# Patient Record
Sex: Female | Born: 1990 | Race: Black or African American | Hispanic: No | Marital: Single | Smoking: Never smoker
Health system: Southern US, Community
[De-identification: ages and names within clinical notes are randomized; demographics above are authoritative.]

## PROBLEM LIST (undated history)

## (undated) DIAGNOSIS — D259 Leiomyoma of uterus, unspecified: Secondary | ICD-10-CM

## (undated) HISTORY — PX: WISDOM TOOTH EXTRACTION: SHX21

---

## 2012-04-13 ENCOUNTER — Emergency Department (HOSPITAL_COMMUNITY)
Admission: EM | Admit: 2012-04-13 | Discharge: 2012-04-13 | Disposition: A | Discharge status: Home / Self Care | Payer: No Typology Code available for payment source | Attending: Emergency Medicine | Admitting: Emergency Medicine

## 2012-04-13 ENCOUNTER — Encounter (HOSPITAL_COMMUNITY): Payer: Self-pay

## 2012-04-13 ENCOUNTER — Emergency Department (HOSPITAL_COMMUNITY): Payer: Self-pay

## 2012-04-13 DIAGNOSIS — S4980XA Other specified injuries of shoulder and upper arm, unspecified arm, initial encounter: Secondary | ICD-10-CM | POA: Insufficient documentation

## 2012-04-13 DIAGNOSIS — S4992XA Unspecified injury of left shoulder and upper arm, initial encounter: Secondary | ICD-10-CM

## 2012-04-13 DIAGNOSIS — M25519 Pain in unspecified shoulder: Secondary | ICD-10-CM | POA: Insufficient documentation

## 2012-04-13 DIAGNOSIS — S46909A Unspecified injury of unspecified muscle, fascia and tendon at shoulder and upper arm level, unspecified arm, initial encounter: Secondary | ICD-10-CM | POA: Insufficient documentation

## 2012-04-13 MED ORDER — OXYCODONE-ACETAMINOPHEN 7.5-325 MG PO TABS: 1.0000 | ORAL_TABLET | ORAL | Status: DC | PRN

## 2012-04-13 MED ORDER — OXYCODONE-ACETAMINOPHEN 5-325 MG PO TABS
2.0000 | ORAL_TABLET | Freq: Once | ORAL | Status: AC
  Administered 2012-04-13: 2 via ORAL
  Filled 2012-04-13: qty 2

## 2012-04-13 NOTE — ED Notes (Signed)
Patient transported to X-ray 
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
## 2012-04-13 NOTE — Progress Notes (Signed)
Orthopedic Tech Progress Note Patient Details:  Lisa Herring 03-21-1991 [id number]  Ortho Devices Type of Ortho Device: Arm foam sling Ortho Device/Splint Location: left arm Ortho Device/Splint Interventions: Application   Lisa Herring 04/13/2012, 5:27 PM
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
## 2012-07-01 ENCOUNTER — Encounter (HOSPITAL_BASED_OUTPATIENT_CLINIC_OR_DEPARTMENT_OTHER): Payer: Self-pay | Admitting: *Deleted

## 2012-07-01 DIAGNOSIS — R51 Headache: Secondary | ICD-10-CM | POA: Insufficient documentation

## 2012-07-01 DIAGNOSIS — Z79899 Other long term (current) drug therapy: Secondary | ICD-10-CM | POA: Insufficient documentation

## 2012-07-01 DIAGNOSIS — Z77028 Contact with and (suspected) exposure to other hazardous aromatic compounds: Secondary | ICD-10-CM | POA: Insufficient documentation

## 2012-07-01 NOTE — ED Notes (Signed)
Dizziness, nausea, and headache earlier while at work. Possible gas leak exposure per patient.

## 2012-07-02 ENCOUNTER — Emergency Department (HOSPITAL_BASED_OUTPATIENT_CLINIC_OR_DEPARTMENT_OTHER)
Admission: EM | Admit: 2012-07-02 | Discharge: 2012-07-02 | Disposition: A | Discharge status: Home / Self Care | Payer: Worker's Compensation | Attending: Emergency Medicine | Admitting: Emergency Medicine

## 2012-07-02 DIAGNOSIS — Z7729 Contact with and (suspected ) exposure to other hazardous substances: Secondary | ICD-10-CM

## 2012-07-02 DIAGNOSIS — R51 Headache: Secondary | ICD-10-CM

## 2012-07-02 LAB — POCT I-STAT 3, VENOUS BLOOD GAS (G3P V)
O2 Saturation: 52 %
Patient temperature: 98.2
TCO2: 28 mmol/L (ref 0–100)
pCO2, Ven: 43.8 mmHg — ABNORMAL LOW (ref 45.0–50.0)
pH, Ven: 7.394 — ABNORMAL HIGH (ref 7.250–7.300)

## 2012-07-02 MED ORDER — ONDANSETRON HCL 8 MG PO TABS
4.0000 mg | ORAL_TABLET | Freq: Once | ORAL | Status: AC
  Administered 2012-07-02: 4 mg via ORAL
  Filled 2012-07-02: qty 1

## 2012-07-02 MED ORDER — IBUPROFEN 800 MG PO TABS
800.0000 mg | ORAL_TABLET | Freq: Once | ORAL | Status: AC
  Administered 2012-07-02: 800 mg via ORAL
  Filled 2012-07-02: qty 1

## 2012-07-02 NOTE — ED Notes (Signed)
MD at bedside. 

## 2012-07-02 NOTE — ED Provider Notes (Signed)
History     CSN: [id number]  Arrival date & time 07/01/12  2248   First MD Initiated Contact with Patient 07/02/12 0011      Chief Complaint  Patient presents with  . Dizziness    (Consider location/radiation/quality/duration/timing/severity/associated sxs/prior treatment) The history is provided by the patient.  Lisa Herring is a 22 y.o. female here with dizziness, nausea, and headache and possible carbon monoxide exposure. 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 Denies being pregnant. Has some dizziness and headaches. No vomiting or SOB.    History reviewed. No pertinent past medical history.  History reviewed. No pertinent past surgical history.  No family history on file.  History  Substance Use Topics  . Smoking status: Never Smoker   . Smokeless tobacco: Not on file  . Alcohol Use: Yes    OB History    Grav Para Term Preterm Abortions TAB SAB Ect Mult Living                  Review of Systems  Neurological: Positive for dizziness and headaches.  All other systems reviewed and are negative.    Allergies  Review of patient's allergies indicates no known allergies.  Home Medications   Current Outpatient Rx  Name  Route  Sig  Dispense  Refill  . BIOTIN PO   Oral   Take 1 tablet by mouth daily.         . OXYCODONE-ACETAMINOPHEN 7.5-325 MG PO TABS   Oral   Take 1 tablet by mouth every 4 (four) hours as needed for pain.   30 tablet   0     BP 137/73  Pulse 87  Temp 98.2 F (36.8 C) (Oral)  Resp 20  Ht 5\' 6"  (1.676 m)  Wt 175 lb (79.379 kg)  BMI 28.25 kg/m2  SpO2 99%  Physical Exam  Nursing note and vitals reviewed. Constitutional: She is oriented to person, place, and time. She appears well-developed and well-nourished.  HENT:  Head: Normocephalic.  Mouth/Throat: Oropharynx is clear and moist.  Eyes: Conjunctivae normal are normal. Pupils are equal, round, and reactive to light.  Neck: Normal range of  motion. Neck supple.  Cardiovascular: Normal rate, regular rhythm and normal heart sounds.   Pulmonary/Chest: Effort normal and breath sounds normal.  Abdominal: Soft. Bowel sounds are normal.  Neurological: She is alert and oriented to person, place, and time.  Skin: Skin is warm and dry.  Psychiatric: She has a normal mood and affect. Her behavior is normal. Judgment and thought content normal.    ED Course  Procedures (including critical care time)  Labs Reviewed  POCT I-STAT 3, BLOOD GAS (G3P V) - Abnormal; Notable for the following:    pH, Ven 7.394 (*)     pCO2, Ven 43.8 (*)     pO2, Ven 28.0 (*)     Bicarbonate 26.8 (*)     All other components within normal limits  CARBOXYHEMOGLOBIN   No results found.   No diagnosis found.    MDM  Lisa Herring is a 22 y.o. female here with possible carbon monoxide exposure. Patient's CO level was 2.4%. Patient placed on non rebreather for about 2 hrs. Now felt better. Stable for d/c.         Richardean Canal, MD 07/02/12 [phone]

## 2012-07-02 NOTE — ED Notes (Signed)
RTT at bedside

## 2012-07-04 LAB — CARBOXYHEMOGLOBIN
Carboxyhemoglobin: 2.4 % — ABNORMAL HIGH (ref 0.5–1.5)
O2 Saturation: 54.3 %
Total hemoglobin: 14.3 g/dL (ref 12.0–16.0)

## 2013-04-18 ENCOUNTER — Encounter (HOSPITAL_BASED_OUTPATIENT_CLINIC_OR_DEPARTMENT_OTHER): Payer: Self-pay | Admitting: Emergency Medicine

## 2013-04-18 ENCOUNTER — Emergency Department (HOSPITAL_BASED_OUTPATIENT_CLINIC_OR_DEPARTMENT_OTHER)
Admission: EM | Admit: 2013-04-18 | Discharge: 2013-04-18 | Disposition: A | Discharge status: Home / Self Care | Payer: Medicaid Other | Attending: Emergency Medicine | Admitting: Emergency Medicine

## 2013-04-18 DIAGNOSIS — B379 Candidiasis, unspecified: Secondary | ICD-10-CM | POA: Insufficient documentation

## 2013-04-18 DIAGNOSIS — N760 Acute vaginitis: Secondary | ICD-10-CM | POA: Insufficient documentation

## 2013-04-18 DIAGNOSIS — A499 Bacterial infection, unspecified: Secondary | ICD-10-CM | POA: Insufficient documentation

## 2013-04-18 DIAGNOSIS — B9689 Other specified bacterial agents as the cause of diseases classified elsewhere: Secondary | ICD-10-CM | POA: Insufficient documentation

## 2013-04-18 DIAGNOSIS — R300 Dysuria: Secondary | ICD-10-CM | POA: Insufficient documentation

## 2013-04-18 DIAGNOSIS — Z3202 Encounter for pregnancy test, result negative: Secondary | ICD-10-CM | POA: Insufficient documentation

## 2013-04-18 LAB — URINALYSIS, ROUTINE W REFLEX MICROSCOPIC
Bilirubin Urine: NEGATIVE
Glucose, UA: NEGATIVE mg/dL
Ketones, ur: NEGATIVE mg/dL
Protein, ur: NEGATIVE mg/dL
pH: 5.5 (ref 5.0–8.0)

## 2013-04-18 LAB — URINE MICROSCOPIC-ADD ON

## 2013-04-18 LAB — WET PREP, GENITAL: Trich, Wet Prep: NONE SEEN

## 2013-04-18 MED ORDER — METRONIDAZOLE 500 MG PO TABS: 500.0000 mg | ORAL_TABLET | Freq: Two times a day (BID) | ORAL | Status: DC

## 2013-04-18 MED ORDER — FLUCONAZOLE 200 MG PO TABS: 200.0000 mg | ORAL_TABLET | Freq: Once | ORAL | Status: AC

## 2013-04-18 NOTE — ED Notes (Signed)
Pt c/o vag burning, vag discharge and itching onset last thursday

## 2013-04-18 NOTE — ED Provider Notes (Signed)
CSN: [id number]     Arrival date & time 04/18/13  1819 History   First MD Initiated Contact with Patient 04/18/13 1822     Chief Complaint  Patient presents with  . Vaginitis    itching and buring with urination   (Consider location/radiation/quality/duration/timing/severity/associated sxs/prior Treatment) The history is provided by the patient and medical records.   This is a 22 year old female with no significant past medical history presenting to the ED for vaginal itching and dysuria x2 days. Patient states she has had some intermittent vaginal discharge, clear in color without odor.  No new sexual partners or concerns for STD at this time. No recent fevers, sweats, or chills.  No abdominal pain, N/V/D.  History reviewed. No pertinent past medical history. History reviewed. No pertinent past surgical history. History reviewed. No pertinent family history. History  Substance Use Topics  . Smoking status: Never Smoker   . Smokeless tobacco: Not on file  . Alcohol Use: Yes   OB History   Grav Para Term Preterm Abortions TAB SAB Ect Mult Living                 Review of Systems  Genitourinary: Positive for dysuria and vaginal discharge.  All other systems reviewed and are negative.    Allergies  Review of patient's allergies indicates no known allergies.  Home Medications  No current outpatient prescriptions on file. BP 118/96  Pulse 60  Temp(Src) 98.2 F (36.8 C) (Oral)  Resp 20  Wt 158 lb (71.668 kg)  BMI 25.51 kg/m2  SpO2 100%  Physical Exam  Nursing note and vitals reviewed. Constitutional: She is oriented to person, place, and time. She appears well-developed and well-nourished.  HENT:  Head: Normocephalic and atraumatic.  Mouth/Throat: Oropharynx is clear and moist.  Eyes: Conjunctivae and EOM are normal. Pupils are equal, round, and reactive to light.  Neck: Normal range of motion.  Cardiovascular: Normal rate, regular rhythm and normal heart sounds.    Pulmonary/Chest: Effort normal and breath sounds normal.  Abdominal: Soft. Bowel sounds are normal. There is no tenderness. There is no guarding.  Genitourinary: There is no lesion on the right labia. There is no lesion on the left labia. Cervix exhibits no motion tenderness. Right adnexum displays no mass and no tenderness. Left adnexum displays no mass and no tenderness. No bleeding around the vagina. Vaginal discharge found.  Clitoral piercing in place; thick, curd-like vaginal discharge present with foul odor; no bleeding; no masses felt; no adnexal or CMT  Musculoskeletal: Normal range of motion.  Neurological: She is alert and oriented to person, place, and time.  Skin: Skin is warm and dry.  Psychiatric: She has a normal mood and affect.    ED Course  Procedures (including critical care time) Labs Review Labs Reviewed  WET PREP, GENITAL - Abnormal; Notable for the following:    Clue Cells Wet Prep HPF POC MODERATE (*)    WBC, Wet Prep HPF POC MANY (*)    All other components within normal limits  URINALYSIS, ROUTINE W REFLEX MICROSCOPIC - Abnormal; Notable for the following:    APPearance CLOUDY (*)    Hgb urine dipstick SMALL (*)    Leukocytes, UA LARGE (*)    All other components within normal limits  URINE MICROSCOPIC-ADD ON - Abnormal; Notable for the following:    Squamous Epithelial / LPF FEW (*)    Bacteria, UA FEW (*)    All other components within normal limits  GC/CHLAMYDIA PROBE  AMP  URINE CULTURE  PREGNANCY, URINE   Imaging Review No results found.  EKG Interpretation   None       MDM   1. Bacterial vaginosis   2. Yeast infection   3. Dysuria    U-preg negative. U/a as above, large leuks- culture pending. Wet prep with moderate clue cells, no yeast seen but pelvic exam consistent with yeast infection.  Will tx with fluconazole and flagyl-- advised not to drink EtOH while taking flagyl.  Instructed she would be contacted in 48-72 hours if culture  results abnormal or require tx.  Discussed plan with pt, she agreed.  Return precautions advised.  Garlon Hatchet, PA-C 04/18/13 2000

## 2013-04-18 NOTE — ED Notes (Signed)
Vaginal itching and dysuria

## 2013-04-18 NOTE — ED Provider Notes (Signed)
Medical screening examination/treatment/procedure(s) were performed by non-physician practitioner and as supervising physician I was immediately available for consultation/collaboration.   Shon Baton, MD 04/18/13 2013

## 2013-04-19 LAB — URINE CULTURE: Colony Count: 70000

## 2013-04-19 LAB — GC/CHLAMYDIA PROBE AMP
CT Probe RNA: NEGATIVE
GC Probe RNA: NEGATIVE

## 2013-10-02 ENCOUNTER — Emergency Department (HOSPITAL_BASED_OUTPATIENT_CLINIC_OR_DEPARTMENT_OTHER)
Admission: EM | Admit: 2013-10-02 | Discharge: 2013-10-02 | Disposition: A | Discharge status: Home / Self Care | Payer: No Typology Code available for payment source | Attending: Emergency Medicine | Admitting: Emergency Medicine

## 2013-10-02 ENCOUNTER — Encounter (HOSPITAL_BASED_OUTPATIENT_CLINIC_OR_DEPARTMENT_OTHER): Payer: Self-pay | Admitting: Emergency Medicine

## 2013-10-02 DIAGNOSIS — A499 Bacterial infection, unspecified: Secondary | ICD-10-CM | POA: Insufficient documentation

## 2013-10-02 DIAGNOSIS — Z3202 Encounter for pregnancy test, result negative: Secondary | ICD-10-CM | POA: Insufficient documentation

## 2013-10-02 DIAGNOSIS — N760 Acute vaginitis: Secondary | ICD-10-CM | POA: Insufficient documentation

## 2013-10-02 DIAGNOSIS — B9689 Other specified bacterial agents as the cause of diseases classified elsewhere: Secondary | ICD-10-CM | POA: Insufficient documentation

## 2013-10-02 LAB — WET PREP, GENITAL
Trich, Wet Prep: NONE SEEN
Yeast Wet Prep HPF POC: NONE SEEN

## 2013-10-02 LAB — URINALYSIS, ROUTINE W REFLEX MICROSCOPIC
Bilirubin Urine: NEGATIVE
Glucose, UA: NEGATIVE mg/dL
Hgb urine dipstick: NEGATIVE
Ketones, ur: NEGATIVE mg/dL
Nitrite: NEGATIVE
Protein, ur: NEGATIVE mg/dL
Specific Gravity, Urine: 1.022 (ref 1.005–1.030)
Urobilinogen, UA: 1 mg/dL (ref 0.0–1.0)
pH: 6 (ref 5.0–8.0)

## 2013-10-02 LAB — URINE MICROSCOPIC-ADD ON

## 2013-10-02 LAB — PREGNANCY, URINE: Preg Test, Ur: NEGATIVE

## 2013-10-02 MED ORDER — METRONIDAZOLE 500 MG PO TABS: 500.0000 mg | ORAL_TABLET | Freq: Two times a day (BID) | ORAL | Status: DC

## 2013-10-02 MED ORDER — METRONIDAZOLE 500 MG PO TABS
500.0000 mg | ORAL_TABLET | Freq: Once | ORAL | Status: AC
Start: 2013-10-02 — End: 2013-10-02
  Administered 2013-10-02: 500 mg via ORAL
  Filled 2013-10-02: qty 1

## 2013-10-02 NOTE — Discharge Instructions (Signed)

## 2013-10-02 NOTE — ED Notes (Signed)
Vaginal d/c x 3 days 

## 2013-10-02 NOTE — ED Provider Notes (Signed)
CSN: [id number]     Arrival date & time 10/02/13  1701 History   First MD Initiated Contact with Patient 10/02/13 1821     Chief Complaint  Patient presents with  . Vaginal Discharge     (Consider location/radiation/quality/duration/timing/severity/associated sxs/prior Treatment) HPI  23 year old female with no prior history of STD presents complaining of vaginal discharge. Patient states for the past 2-3 days she has noticed vaginal discharge with odor. The odor is what concerns her prompting her to come to ER. Aside from vaginal discharge patient denies fever, chills, headache, chest pain, shortness of breath, abdominal pain, back pain, dysuria, hematuria, rash. She denies any pain with sexual activities. She has one sexual partners within the past 6 months but does not use protection every single time. She has never been pregnant before.  History reviewed. No pertinent past medical history. History reviewed. No pertinent past surgical history. No family history on file. History  Substance Use Topics  . Smoking status: Never Smoker   . Smokeless tobacco: Not on file  . Alcohol Use: No   OB History   Grav Para Term Preterm Abortions TAB SAB Ect Mult Living                 Review of Systems  Constitutional: Negative for fever.  Genitourinary: Positive for vaginal discharge. Negative for dysuria, vaginal bleeding and vaginal pain.  Skin: Negative for rash.  All other systems reviewed and are negative.      Allergies  Review of patient's allergies indicates no known allergies.  Home Medications   Current Outpatient Rx  Name  Route  Sig  Dispense  Refill  . metroNIDAZOLE (FLAGYL) 500 MG tablet   Oral   Take 1 tablet (500 mg total) by mouth 2 (two) times daily.   14 tablet   0    BP 127/68  Pulse 71  Temp(Src) 98.6 F (37 C) (Oral)  Resp 16  Ht 5\' 6"  (1.676 m)  Wt 160 lb (72.576 kg)  BMI 25.84 kg/m2  SpO2 100%  LMP 09/08/2013 Physical Exam  Nursing note and  vitals reviewed. Constitutional: She appears well-developed and well-nourished. No distress.  HENT:  Head: Normocephalic and atraumatic.  Eyes: Conjunctivae are normal.  Neck: Normal range of motion. Neck supple.  Cardiovascular: Normal rate and regular rhythm.   Pulmonary/Chest: Effort normal and breath sounds normal. She exhibits no tenderness.  Abdominal: Soft. There is no tenderness.  Genitourinary: Vagina normal and uterus normal. There is no rash or lesion on the right labia. There is no rash or lesion on the left labia. Cervix exhibits no motion tenderness and no discharge. Right adnexum displays no mass and no tenderness. Left adnexum displays no mass and no tenderness. No erythema, tenderness or bleeding around the vagina. No vaginal discharge found.  Chaperone present:  No inguinal lymphadenopathy, normal labia majora, normal labia minora, clitoris piercing noted without any signs of infection, vaginal vault with mild functional discharge with odor. On bimanual exam, no left or right adnexal tenderness and no cervical motion tenderness, cervical os is closed.  Lymphadenopathy:       Right: No inguinal adenopathy present.       Left: No inguinal adenopathy present.  Skin: No rash noted.    ED Course  Procedures (including critical care time)  7:38 PM Patient presents with vaginal discharge. No evidence of cervical motion tenderness concerning for PID. She is afebrile with stable normal vital sign and has a benign abdomen. I offered  the options of being treated prophylactically for GC and Chlamydia with Rocephin and Zithromax however patient prefers to wait.    9:03 PM Result indicate BV.  Will treat with flagyl.  Return precaution discussed.    Labs Review Labs Reviewed  WET PREP, GENITAL - Abnormal; Notable for the following:    Clue Cells Wet Prep HPF POC TOO NUMEROUS TO COUNT (*)    WBC, Wet Prep HPF POC FEW (*)    All other components within normal limits  URINALYSIS,  ROUTINE W REFLEX MICROSCOPIC - Abnormal; Notable for the following:    APPearance CLOUDY (*)    Leukocytes, UA MODERATE (*)    All other components within normal limits  URINE MICROSCOPIC-ADD ON - Abnormal; Notable for the following:    Squamous Epithelial / LPF FEW (*)    All other components within normal limits  GC/CHLAMYDIA PROBE AMP  PREGNANCY, URINE   Imaging Review No results found.   EKG Interpretation None      MDM   Final diagnoses:  BV (bacterial vaginosis)    BP 127/68  Pulse 71  Temp(Src) 98.6 F (37 C) (Oral)  Resp 16  Ht 5\' 6"  (1.676 m)  Wt 160 lb (72.576 kg)  BMI 25.84 kg/m2  SpO2 100%  LMP 09/08/2013     Fayrene Helper, PA-C 10/02/13 2103

## 2013-10-02 NOTE — ED Provider Notes (Signed)
Medical screening examination/treatment/procedure(s) were performed by non-physician practitioner and as supervising physician I was immediately available for consultation/collaboration.     Juliann Olesky, MD 10/02/13 2251 

## 2013-10-03 LAB — GC/CHLAMYDIA PROBE AMP
CT Probe RNA: NEGATIVE
GC PROBE AMP APTIMA: NEGATIVE
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
## 2022-06-30 ENCOUNTER — Ambulatory Visit
Admission: EM | Admit: 2022-06-30 | Discharge: 2022-06-30 | Disposition: A | Discharge status: Home / Self Care | Payer: 59 | Attending: Family Medicine | Admitting: Family Medicine

## 2022-06-30 DIAGNOSIS — J069 Acute upper respiratory infection, unspecified: Secondary | ICD-10-CM | POA: Diagnosis not present

## 2022-06-30 MED ORDER — IBUPROFEN 800 MG PO TABS: 800.0000 mg | ORAL_TABLET | Freq: Three times a day (TID) | ORAL | 0 refills | Status: DC | PRN

## 2022-06-30 NOTE — Discharge Instructions (Signed)
Take ibuprofen 800 mg--1 tab every 8 hours as needed for pain.   You have been swabbed for COVID, and the test will result in the next 24 hours. Our staff will call you if positive. If the COVID test is positive, you should quarantine for 5 days from the start of your symptoms

## 2022-06-30 NOTE — ED Provider Notes (Signed)
[facility]    CSN: [id number] Arrival date & time: 06/30/22  1216      History   Chief Complaint Chief Complaint  Patient presents with   Ear Fullness    HPI Lisa Herring is a 32 y.o. female.    Ear Fullness   Here for itchy feeling in her throat and pain in her ears.  On December 31 she had some itching in her throat and cough drops help that get better.  No throat pain.  Then yesterday her ear started hurting and it first her left was worse now that is improved some and her right one is worse.  No fever or chills.  She does also have some nasal congestion and rhinorrhea.  No cough.  No nausea or vomiting or diarrhea  Last menstrual cycle was December 2  History reviewed. No pertinent past medical history.  There are no problems to display for this patient.   History reviewed. No pertinent surgical history.  OB History   No obstetric history on file.      Home Medications    Prior to Admission medications   Medication Sig Start Date End Date Taking? Authorizing Provider  ibuprofen (ADVIL) 800 MG tablet Take 1 tablet (800 mg total) by mouth every 8 (eight) hours as needed (pain). 06/30/22  Yes Demarkis Gheen, Gwenlyn Perking, MD  norethindrone-ethinyl estradiol-FE (LOESTRIN FE) 1-20 MG-MCG tablet Take 1 tablet by mouth daily.   Yes [provider]    Family History No family history on file.  Social History Social History   Tobacco Use   Smoking status: Never   Smokeless tobacco: Never  Vaping Use   Vaping Use: Never used  Substance Use Topics   Alcohol use: No   Drug use: No     Allergies   Patient has no known allergies.   Review of Systems Review of Systems   Physical Exam Triage Vital Signs ED Triage Vitals  Enc Vitals Group     BP 06/30/22 1225 (!) 150/79     Pulse Rate 06/30/22 1225 79     Resp 06/30/22 1225 15     Temp 06/30/22 1225 98.1 F (36.7 C)     Temp Source 06/30/22 1225 Oral     SpO2 06/30/22 1225 96 %      Weight --      Height --      Head Circumference --      Peak Flow --      Pain Score 06/30/22 1229 0     Pain Loc --      Pain Edu? --      Excl. in [place]? --    No data found.  Updated Vital Signs BP (!) 150/79 (BP Location: Right Arm)   Pulse 79   Temp 98.1 F (36.7 C) (Oral)   Resp 15   LMP 05/30/2022 (Approximate)   SpO2 96%   Visual Acuity Right Eye Distance:   Left Eye Distance:   Bilateral Distance:    Right Eye Near:   Left Eye Near:    Bilateral Near:     Physical Exam Vitals reviewed.  Constitutional:      General: She is not in acute distress.    Appearance: She is not toxic-appearing.  HENT:     Right Ear: Tympanic membrane and ear canal normal.     Left Ear: Tympanic membrane and ear canal normal.     Nose: Congestion present.     Mouth/Throat:  Mouth: Mucous membranes are moist.     Pharynx: No oropharyngeal exudate or posterior oropharyngeal erythema.  Eyes:     Extraocular Movements: Extraocular movements intact.     Conjunctiva/sclera: Conjunctivae normal.     Pupils: Pupils are equal, round, and reactive to light.  Cardiovascular:     Rate and Rhythm: Normal rate and regular rhythm.     Heart sounds: No murmur heard. Pulmonary:     Effort: Pulmonary effort is normal. No respiratory distress.     Breath sounds: No stridor. No wheezing, rhonchi or rales.  Musculoskeletal:     Cervical back: Neck supple.  Lymphadenopathy:     Cervical: No cervical adenopathy.  Skin:    Capillary Refill: Capillary refill takes less than 2 seconds.     Coloration: Skin is not jaundiced or pale.  Neurological:     General: No focal deficit present.     Mental Status: She is alert and oriented to person, place, and time.  Psychiatric:        Behavior: Behavior normal.      UC Treatments / Results  Labs (all labs ordered are listed, but only abnormal results are displayed) Labs Reviewed  SARS CORONAVIRUS 2 (TAT 6-24 HRS)    EKG   Radiology No  results found.  Procedures Procedures (including critical care time)  Medications Ordered in UC Medications - No data to display  Initial Impression / Assessment and Plan / UC Course  I have reviewed the triage vital signs and the nursing notes.  Pertinent labs & imaging results that were available during my care of the patient were reviewed by me and considered in my medical decision making (see chart for details).        Most likely is either a mild viral upper respiratory infection or allergies causing her symptoms.  Ibuprofen is sent for the pain, and she is going to continue NyQuil and DayQuil as needed.  COVID swab was done, so she knows if she needs to quarantine.  Work note is supplied; she works remotely so she will be able to work tomorrow as she requests Final Clinical Impressions(s) / UC Diagnoses   Final diagnoses:  Viral URI     Discharge Instructions      Take ibuprofen 800 mg--1 tab every 8 hours as needed for pain.   You have been swabbed for COVID, and the test will result in the next 24 hours. Our staff will call you if positive. If the COVID test is positive, you should quarantine for 5 days from the start of your symptoms      ED Prescriptions     Medication Sig Dispense Auth. Provider   ibuprofen (ADVIL) 800 MG tablet Take 1 tablet (800 mg total) by mouth every 8 (eight) hours as needed (pain). 21 tablet Zayonna Ayuso, Gwenlyn Perking, MD      PDMP not reviewed this encounter.   Barrett Henle, MD 06/30/22 [phone]

## 2022-06-30 NOTE — ED Notes (Signed)
PT REFUSED COVID TEST DUE TO TEST IS NOT RAPID-MD NOTIFIED

## 2022-06-30 NOTE — ED Triage Notes (Signed)
Pt c/o bilat ear pressure started yesterday-left ear worse than right-itchy throat stated 3 days ago/is better-NAD-steady gait

## 2023-05-15 ENCOUNTER — Emergency Department (HOSPITAL_COMMUNITY): Payer: 59

## 2023-05-15 ENCOUNTER — Encounter (HOSPITAL_COMMUNITY): Payer: Self-pay | Admitting: Emergency Medicine

## 2023-05-15 ENCOUNTER — Inpatient Hospital Stay (HOSPITAL_COMMUNITY)
Admission: EM | Admit: 2023-05-15 | Discharge: 2023-05-19 | DRG: 661 | Disposition: A | Discharge status: Home / Self Care | Payer: 59 | Attending: Internal Medicine | Admitting: Internal Medicine

## 2023-05-15 ENCOUNTER — Other Ambulatory Visit: Payer: Self-pay

## 2023-05-15 DIAGNOSIS — R109 Unspecified abdominal pain: Secondary | ICD-10-CM | POA: Diagnosis not present

## 2023-05-15 DIAGNOSIS — N179 Acute kidney failure, unspecified: Secondary | ICD-10-CM | POA: Diagnosis present

## 2023-05-15 DIAGNOSIS — N131 Hydronephrosis with ureteral stricture, not elsewhere classified: Secondary | ICD-10-CM | POA: Diagnosis not present

## 2023-05-15 DIAGNOSIS — N3289 Other specified disorders of bladder: Secondary | ICD-10-CM | POA: Diagnosis present

## 2023-05-15 DIAGNOSIS — N939 Abnormal uterine and vaginal bleeding, unspecified: Secondary | ICD-10-CM | POA: Diagnosis not present

## 2023-05-15 DIAGNOSIS — Z6830 Body mass index (BMI) 30.0-30.9, adult: Secondary | ICD-10-CM

## 2023-05-15 DIAGNOSIS — N852 Hypertrophy of uterus: Secondary | ICD-10-CM

## 2023-05-15 DIAGNOSIS — R319 Hematuria, unspecified: Secondary | ICD-10-CM | POA: Diagnosis not present

## 2023-05-15 DIAGNOSIS — Z793 Long term (current) use of hormonal contraceptives: Secondary | ICD-10-CM

## 2023-05-15 DIAGNOSIS — Z7982 Long term (current) use of aspirin: Secondary | ICD-10-CM

## 2023-05-15 DIAGNOSIS — N858 Other specified noninflammatory disorders of uterus: Secondary | ICD-10-CM

## 2023-05-15 DIAGNOSIS — N1330 Unspecified hydronephrosis: Secondary | ICD-10-CM

## 2023-05-15 DIAGNOSIS — R52 Pain, unspecified: Secondary | ICD-10-CM

## 2023-05-15 DIAGNOSIS — K5900 Constipation, unspecified: Secondary | ICD-10-CM | POA: Diagnosis present

## 2023-05-15 DIAGNOSIS — E669 Obesity, unspecified: Secondary | ICD-10-CM | POA: Diagnosis present

## 2023-05-15 DIAGNOSIS — D259 Leiomyoma of uterus, unspecified: Secondary | ICD-10-CM | POA: Diagnosis present

## 2023-05-15 HISTORY — DX: Leiomyoma of uterus, unspecified: D25.9

## 2023-05-15 LAB — COMPREHENSIVE METABOLIC PANEL
ALT: 27 U/L (ref 0–44)
AST: 21 U/L (ref 15–41)
Albumin: 3.9 g/dL (ref 3.5–5.0)
Alkaline Phosphatase: 58 U/L (ref 38–126)
Anion gap: 11 (ref 5–15)
BUN: 11 mg/dL (ref 6–20)
CO2: 23 mmol/L (ref 22–32)
Calcium: 9.7 mg/dL (ref 8.9–10.3)
Chloride: 104 mmol/L (ref 98–111)
Creatinine, Ser: 1.3 mg/dL — ABNORMAL HIGH (ref 0.44–1.00)
GFR, Estimated: 56 mL/min — ABNORMAL LOW (ref >60)
Glucose, Bld: 106 mg/dL — ABNORMAL HIGH (ref 70–99)
Potassium: 3.8 mmol/L (ref 3.5–5.1)
Sodium: 138 mmol/L (ref 135–145)
Total Bilirubin: 0.6 mg/dL (ref <1.2)
Total Protein: 7.6 g/dL (ref 6.5–8.1)

## 2023-05-15 LAB — URINALYSIS, ROUTINE W REFLEX MICROSCOPIC
Bilirubin Urine: NEGATIVE
Glucose, UA: NEGATIVE mg/dL
Hgb urine dipstick: NEGATIVE
Ketones, ur: NEGATIVE mg/dL
Leukocytes,Ua: NEGATIVE
Nitrite: NEGATIVE
Protein, ur: NEGATIVE mg/dL
Specific Gravity, Urine: 1.018 (ref 1.005–1.030)
pH: 6 (ref 5.0–8.0)

## 2023-05-15 LAB — CBC
HCT: 43.6 % (ref 36.0–46.0)
Hemoglobin: 14.6 g/dL (ref 12.0–15.0)
MCH: 29.2 pg (ref 26.0–34.0)
MCHC: 33.5 g/dL (ref 30.0–36.0)
MCV: 87.2 fL (ref 80.0–100.0)
Platelets: 240 10*3/uL (ref 150–400)
RBC: 5 MIL/uL (ref 3.87–5.11)
RDW: 12.9 % (ref 11.5–15.5)
WBC: 5.6 10*3/uL (ref 4.0–10.5)
nRBC: 0 % (ref 0.0–0.2)

## 2023-05-15 LAB — LIPASE, BLOOD: Lipase: 32 U/L (ref 11–51)

## 2023-05-15 LAB — HCG, SERUM, QUALITATIVE: Preg, Serum: NEGATIVE

## 2023-05-15 MED ORDER — ONDANSETRON HCL 4 MG/2ML IJ SOLN
4.0000 mg | Freq: Once | INTRAMUSCULAR | Status: AC
  Administered 2023-05-15: 4 mg via INTRAVENOUS
  Filled 2023-05-15: qty 2

## 2023-05-15 MED ORDER — HYDROMORPHONE HCL 1 MG/ML IJ SOLN
0.5000 mg | Freq: Once | INTRAMUSCULAR | Status: AC
  Administered 2023-05-15: 0.5 mg via INTRAVENOUS
  Filled 2023-05-15: qty 1

## 2023-05-15 MED ORDER — HYDROMORPHONE HCL 1 MG/ML IJ SOLN
1.0000 mg | Freq: Once | INTRAMUSCULAR | Status: AC
  Administered 2023-05-15: 1 mg via INTRAVENOUS
  Filled 2023-05-15: qty 1

## 2023-05-15 MED ORDER — MORPHINE SULFATE (PF) 4 MG/ML IV SOLN
4.0000 mg | Freq: Once | INTRAVENOUS | Status: AC
  Administered 2023-05-15: 4 mg via INTRAVENOUS
  Filled 2023-05-15: qty 1

## 2023-05-15 MED ORDER — HYDROMORPHONE HCL 1 MG/ML IJ SOLN
2.0000 mg | Freq: Once | INTRAMUSCULAR | Status: DC
  Filled 2023-05-15: qty 2

## 2023-05-15 MED ORDER — KETOROLAC TROMETHAMINE 15 MG/ML IJ SOLN
15.0000 mg | Freq: Once | INTRAMUSCULAR | Status: AC
  Administered 2023-05-15: 15 mg via INTRAVENOUS
  Filled 2023-05-15: qty 1

## 2023-05-15 MED ORDER — SODIUM CHLORIDE 0.9 % IV SOLN
12.5000 mg | Freq: Four times a day (QID) | INTRAVENOUS | Status: DC | PRN
  Administered 2023-05-15 – 2023-05-18 (×2): 12.5 mg via INTRAVENOUS
  Filled 2023-05-15 (×3): qty 0.5

## 2023-05-15 MED ORDER — HYDROCODONE-ACETAMINOPHEN 5-325 MG PO TABS
2.0000 | ORAL_TABLET | Freq: Once | ORAL | Status: AC
  Administered 2023-05-15: 2 via ORAL
  Filled 2023-05-15: qty 2

## 2023-05-15 NOTE — ED Triage Notes (Signed)
PT arrives via POV from home with back pain abdominal pain and vomited multiple times since Wednesday. Seen at urgent care yesterday states symptoms worsened last night. Now doubled over in pain in triage.

## 2023-05-15 NOTE — ED Notes (Signed)
Hospitalist at bedside 

## 2023-05-15 NOTE — ED Provider Notes (Signed)
Accepted handoff at shift change from [facility]. Please see prior provider note for full HPI.  Briefly: Patient is a 32 y.o. female who presents to the ER for flank pain radiating to the groin. Went to UC and had x-ray, was told she was complicated. Had normal pelvic exam. Has received pain meds and antiemetics.   DDX/Plan: Labs unremarkable, pending UA and CT. If CT unremarkable, consider ovarian torsion r/o.   Physical Exam  BP (!) 114/47 (BP Location: Right Arm)   Pulse 95   Temp 98.3 F (36.8 C)   Resp 16   Ht 5\' 6"  (1.676 m)   Wt 86.2 kg   LMP 05/08/2023   SpO2 100%   BMI 30.67 kg/m   Physical Exam Vitals and nursing note reviewed.  Constitutional:      Appearance: Normal appearance.  HENT:     Head: Normocephalic and atraumatic.  Eyes:     Conjunctiva/sclera: Conjunctivae normal.  Cardiovascular:     Rate and Rhythm: Normal rate and regular rhythm.  Pulmonary:     Effort: Pulmonary effort is normal. No respiratory distress.     Breath sounds: Normal breath sounds.  Abdominal:     General: There is no distension.     Palpations: Abdomen is soft. There is mass.     Tenderness: There is abdominal tenderness in the right lower quadrant and suprapubic area.     Comments: Not well circumscribed lower abdominal mass to palpation  Skin:    General: Skin is warm and dry.  Neurological:     General: No focal deficit present.     Mental Status: She is alert.    ED Course / MDM    Medical Decision Making Amount and/or Complexity of Data Reviewed Labs: ordered. Radiology: ordered.  Risk Prescription drug management. Decision regarding hospitalization.  1630 -- Consulted with Dr Debroah Loop with OBGYN who reviewed patient's images, recommended consulting patient's GYN practice provider on call.  1647 -- Consulted with Dr Wendall Stade with [facility]. She reviewed patient's prior records with their office from March 2023, imaging showed 10 cm fibroid. She recommends  patient follow up for scheduling of surgery for either myomectomy or hysterectomy if patient could wait until next week. Dr Wendall Stade contacted their office to get her scheduled for follow up hopefully on Monday to discuss options and schedule surgery.   1700 -- Reevaluated patient, pain is only minimally improved. Discussed discharge with OBGYN follow up and patient does not feel comfortable with this due to her level of ongoing pain. Pelvic US ordered. Plan for admission for pain control after torsion ruled out on [place]. OBGYN will see in AM.   1930 -- Pelvic US with markedly enlarged uterus, diffusely heterogenous with no well-defined masses. No evidence of torsion.   1940 -- Consulted with hospitalist Dr Joneen Roach who felt patient could be admitted by GYN as primary team for pain control.   1950 -- Consulted again with GYN who felt patient was not a GYN-primary patient with her hydronephrosis and they would not be intervening on the uterine mass imminently. Recommended urology consult.   2012 -- Consulted with urologist Dr Margo Aye who does not feel strongly patient needs a ureteral stent imminently but they will see while admitted.   2040 -- Patient refused next dose of pain medication. Has communciated some feelings of possible side effects including chest pain, dizziness, etc.   2200 -- Hospitalist Dr Joneen Roach evaluated the patient and felt they did not meet criteria for  admission. They feel GYN must admit the patient if it is felt they must be admitted.  2240 -- Reevaluated patient. Patient and family at bedside are very frustrated by the changing of plans. Reaffirmed that our goal is to control her pain. Seen in conjunction with attending physician Dr Earlene Plater. Plan to give oral pain medicine and if patient's pain not controlled with PO, attempt admission again.   2350 -- Patient's pain is persistent despite PO medications. Concerned about not having a BM even prior to ED visit, worried this is being  worsening with multiple rounds of pain medications.   0000 -- Dr Joneen Roach will admit patient for pain control.    Jeanella Flattery 05/16/23 0002    Laurence Spates, MD 05/16/23 [phone]

## 2023-05-15 NOTE — Consult Note (Signed)
PCP:   Pcp, No   Chief Complaint:  Right flank pain.  HPI: This is a 32 year old female with no medical history.  She presents with complaints of right flank pain that has been present since Friday.  Her pain occasionally radiates to the bilateral hips.  On Wednesday the patient went to urgent care.  She was told she has constipation.  Medications taken for constipation did not impact discomfort.  Today she developed nausea and vomiting and her pain became severe.  She came to [facility], ER.  In the ER patient hemodynamically stable.  CT abdomen pelvis shows markedly enlarged uterus.  Mild to moderate hydronephrosis due to mass effect on right ureter from enlarged uterus.  Patient is received 4 mg of morphine. 0.5 and 1 mg IV Dilaudid.  Pain not fully controlled.  Hospital admission requested for pain control.  OB/GYN and urologist contacted by EDP.  Review of Systems:  Per HPI  Past Medical History: History reviewed. No pertinent past medical history. History reviewed. No pertinent surgical history.  Medications: Prior to Admission medications   Medication Sig Start Date End Date Taking? Authorizing Provider  Aspirin-Acetaminophen-Caffeine (GOODY HEADACHE PO) Take 1 Package by mouth daily as needed (headache, pain).   Yes [provider]  ibuprofen (ADVIL) 800 MG tablet Take 1 tablet (800 mg total) by mouth every 8 (eight) hours as needed (pain). 06/30/22  Yes Zenia Resides, MD  Norethin Ace-Eth Estrad-FE (JUNEL FE 24 PO) Take 1 tablet by mouth daily.   Yes [provider]    Allergies:  No Known Allergies  Social History:  reports that she has never smoked. She has never used smokeless tobacco. She reports that she does not drink alcohol and does not use drugs.  Family History: History reviewed. No pertinent family history.  Physical Exam: Vitals:   05/15/23 1530 05/15/23 1840 05/15/23 1900 05/15/23 1915  BP: 125/85 127/88 (!) 127/92 116/75  Pulse: 81 71  78 82  Resp:  10 13 11   Temp:  (!) 97.5 F (36.4 C)    TempSrc:  Oral    SpO2: 98% 97% 91% 93%  Weight:      Height:        General:  A&O x3, well developed and nourished, today in no acute distress Eyes: Pink conjunctiva, no scleral icterus ENT: Moist oral mucosa, neck supple, no thyromegaly Lungs: CTA B/L, no wheeze, no crackles, no use of accessory muscles Cardiovascular: RRR, no regurgitation, no gallops, no murmurs. No carotid bruits, no JVD Abdomen: soft, positive BS, positive fullness in suprapubic region, positive TTP greatest in RLQ, Not an acute abdomen GU: not examined Neuro: CN II - XII grossly intact, sensation intact Musculoskeletal: strength 5/5 all extremities, no clubbing, cyanosis or edema Skin: no rash, no subcutaneous crepitation, no decubitus Psych: appropriate patient   Labs on Admission:  Recent Labs    05/15/23 1324  NA 138  K 3.8  CL 104  CO2 23  GLUCOSE 106*  BUN 11  CREATININE 1.30*  CALCIUM 9.7   Recent Labs    05/15/23 1324  AST 21  ALT 27  ALKPHOS 58  BILITOT 0.6  PROT 7.6  ALBUMIN 3.9   Recent Labs    05/15/23 1324  LIPASE 32   Recent Labs    05/15/23 1324  WBC 5.6  HGB 14.6  HCT 43.6  MCV 87.2  PLT 240    Radiological Exams on Admission: US Pelvis Complete  Result Date: 05/15/2023 CLINICAL  DATA:  Right flank pain, enlarged uterus on CT EXAM: TRANSABDOMINAL ULTRASOUND OF PELVIS DOPPLER ULTRASOUND OF OVARIES TECHNIQUE: Transabdominal ultrasound examination of the pelvis was performed including evaluation of the uterus, ovaries, adnexal regions, and pelvic cul-de-sac. Color and duplex Doppler ultrasound was utilized to evaluate blood flow to the ovaries. COMPARISON:  05/15/2023 FINDINGS: Uterus Measurements: 18.3 x 10.9 x 15.5 cm. The uterus is diffusely heterogeneous. No discrete fibroids are visualized on transabdominal imaging. Patient could not tolerate endovaginal exam due to discomfort. Endometrium The endometrial  stripe is not well visualized. Right ovary Measurements: 5.1 x 2.3 x 2.3 cm = volume: 14.2 mL. Normal appearance/no adnexal mass. Left ovary Measurements: 3.5 x 1.4 x 2.3 cm = volume: 5.7 mL. Normal appearance/no adnexal mass. Pulsed Doppler evaluation demonstrates normal low-resistance arterial and venous waveforms in both ovaries. Other: No free fluid. IMPRESSION: 1. Markedly enlarged uterus, which demonstrates diffusely heterogeneous echotexture but no well-defined masses. The endometrial stripe is not well visualized. Given the lack of visible fibroids and the nonvisualization of the endometrium, further evaluation with nonemergent pelvic MRI as well as gynecologic consultation may be useful to exclude underlying endometrial or uterine neoplasm. 2. Unremarkable appearance of the ovaries.  No evidence of torsion. Electronically Signed   By: Sharlet Salina M.D.   On: 05/15/2023 18:48   [place] Art/Ven Flow Abd Pelv Doppler  Result Date: 05/15/2023 CLINICAL DATA:  Right flank pain, enlarged uterus on CT EXAM: TRANSABDOMINAL ULTRASOUND OF PELVIS DOPPLER ULTRASOUND OF OVARIES TECHNIQUE: Transabdominal ultrasound examination of the pelvis was performed including evaluation of the uterus, ovaries, adnexal regions, and pelvic cul-de-sac. Color and duplex Doppler ultrasound was utilized to evaluate blood flow to the ovaries. COMPARISON:  05/15/2023 FINDINGS: Uterus Measurements: 18.3 x 10.9 x 15.5 cm. The uterus is diffusely heterogeneous. No discrete fibroids are visualized on transabdominal imaging. Patient could not tolerate endovaginal exam due to discomfort. Endometrium The endometrial stripe is not well visualized. Right ovary Measurements: 5.1 x 2.3 x 2.3 cm = volume: 14.2 mL. Normal appearance/no adnexal mass. Left ovary Measurements: 3.5 x 1.4 x 2.3 cm = volume: 5.7 mL. Normal appearance/no adnexal mass. Pulsed Doppler evaluation demonstrates normal low-resistance arterial and venous waveforms in both ovaries.  Other: No free fluid. IMPRESSION: 1. Markedly enlarged uterus, which demonstrates diffusely heterogeneous echotexture but no well-defined masses. The endometrial stripe is not well visualized. Given the lack of visible fibroids and the nonvisualization of the endometrium, further evaluation with nonemergent pelvic MRI as well as gynecologic consultation may be useful to exclude underlying endometrial or uterine neoplasm. 2. Unremarkable appearance of the ovaries.  No evidence of torsion. Electronically Signed   By: Sharlet Salina M.D.   On: 05/15/2023 18:48   CT Renal Stone Study  Result Date: 05/15/2023 CLINICAL DATA:  Right-sided abdominal and flank pain for several days. Vomiting. EXAM: CT ABDOMEN AND PELVIS WITHOUT CONTRAST TECHNIQUE: Multidetector CT imaging of the abdomen and pelvis was performed following the standard protocol without IV contrast. RADIATION DOSE REDUCTION: This exam was performed according to the departmental dose-optimization program which includes automated exposure control, adjustment of the mA and/or kV according to patient size and/or use of iterative reconstruction technique. COMPARISON:  None Available. FINDINGS: Lower chest: No acute findings. Hepatobiliary: No mass visualized on this unenhanced exam. Gallbladder is unremarkable. No evidence of biliary ductal dilatation. Pancreas: No mass or inflammatory process visualized on this unenhanced exam. Spleen:  Within normal limits in size. Adrenals/Urinary tract: Mild to moderate right hydroureteronephrosis is seen no ureteral  calculi identified. This appears to be due to mass effect on the distal right ureter by markedly enlarged uterus. Stomach/Bowel: No evidence of obstruction, inflammatory process, or abnormal fluid collections. Normal appendix visualized. Vascular/Lymphatic: No pathologically enlarged lymph nodes identified. No evidence of abdominal aortic aneurysm. Reproductive: Markedly enlarged uterus, likely due to fibroids.  Adnexal regions are unremarkable. Tiny amount of free fluid noted in pelvis. Other:  None. Musculoskeletal:  No suspicious bone lesions identified. IMPRESSION: Markedly enlarged uterus, likely due to fibroids. Mild to moderate right hydroureteronephrosis, due to mass effect on the distal right ureter by the markedly enlarged uterus. No ureteral calculi identified. Electronically Signed   By: Danae Orleans M.D.   On: 05/15/2023 16:20    Assessment/Plan  Enlarged uterus -OB/GYN Dr Dr Threasa Alpha contacted by EDP.   -Management Per OB/GYN.   Mild to moderate right-sided hydronephrosis -Secondary to enlarged uterus.  Urologist Dr. Margo Aye contacted by EDP. Dr Margo Aye reviewed patient's CT and didn't feel she needed a ureteral stent immediately.  Formal consult in a.m.   Inadequate pain control -Patient seen, EDP has given patient 2 mg IV Dilaudid.  Patient pain well-controlled.  No intervention needed from hospitalist viewpoint.  Hospitalist will sign off.  Reconsult if needed.    Ulisses Vondrak 05/15/2023, 10:04 PM

## 2023-05-15 NOTE — ED Provider Notes (Signed)
[place] EMERGENCY DEPARTMENT AT [facility] Provider Note   CSN: [id number] Arrival date & time: 05/15/23  1306     History  Chief Complaint  Patient presents with   Abdominal Pain   Back Pain   Constipation   Emesis    Lisa Herring is a 32 y.o. female.  Patient presents to the emergency department today for evaluation of right-sided flank pain.  She has had associated constipation and then vomiting that started this morning.  Pain started about 5 days ago.  It is progressively worsened.  She has seen urgent care and had an x-ray done.  She also reports having a pelvic exam performed and denies vaginal bleeding or discharge.  No history of abdominal surgeries.  No dysuria, hematuria, increased frequency or urgency.  She has not had pain like this in the past.  It is deep, not really made worse with palpation.  It is in the right flank and radiates to the right lower abdomen.      Home Medications Prior to Admission medications   Medication Sig Start Date End Date Taking? Authorizing Provider  ibuprofen (ADVIL) 800 MG tablet Take 1 tablet (800 mg total) by mouth every 8 (eight) hours as needed (pain). 06/30/22   Zenia Resides, MD  norethindrone-ethinyl estradiol-FE (LOESTRIN FE) 1-20 MG-MCG tablet Take 1 tablet by mouth daily.    [provider]      Allergies    Patient has no known allergies.    Review of Systems   Review of Systems  Physical Exam Updated Vital Signs BP (!) 114/47 (BP Location: Right Arm)   Pulse 95   Temp 98.3 F (36.8 C)   Resp 16   Ht 5\' 6"  (1.676 m)   Wt 86.2 kg   LMP 05/08/2023   SpO2 100%   BMI 30.67 kg/m   Physical Exam Vitals and nursing note reviewed.  Constitutional:      General: She is not in acute distress.    Appearance: She is well-developed.  HENT:     Head: Normocephalic and atraumatic.     Right Ear: External ear normal.     Left Ear: External ear normal.     Nose: Nose normal.  Eyes:      Conjunctiva/sclera: Conjunctivae normal.  Cardiovascular:     Rate and Rhythm: Normal rate and regular rhythm.     Heart sounds: No murmur heard. Pulmonary:     Effort: No respiratory distress.     Breath sounds: No wheezing, rhonchi or rales.  Abdominal:     Palpations: Abdomen is soft.     Tenderness: There is abdominal tenderness (Mild) in the right lower quadrant. There is no guarding or rebound. Negative signs include Murphy's sign and McBurney's sign.  Musculoskeletal:     Cervical back: Normal range of motion and neck supple.     Right lower leg: No edema.     Left lower leg: No edema.  Skin:    General: Skin is warm and dry.     Findings: No rash.  Neurological:     General: No focal deficit present.     Mental Status: She is alert. Mental status is at baseline.     Motor: No weakness.  Psychiatric:        Mood and Affect: Mood normal.     ED Results / Procedures / Treatments   Labs (all labs ordered are listed, but only abnormal results are displayed) Labs Reviewed  COMPREHENSIVE METABOLIC PANEL - Abnormal; Notable for the following components:      Result Value   Glucose, Bld 106 (*)    Creatinine, Ser 1.30 (*)    GFR, Estimated 56 (*)    All other components within normal limits  LIPASE, BLOOD  CBC  HCG, SERUM, QUALITATIVE  URINALYSIS, ROUTINE W REFLEX MICROSCOPIC    EKG None  Radiology No results found.  Procedures Procedures    Medications Ordered in ED Medications  promethazine (PHENERGAN) 12.5 mg in sodium chloride 0.9 % 50 mL IVPB (has no administration in time range)  morphine (PF) 4 MG/ML injection 4 mg (4 mg Intravenous Given 05/15/23 1405)  ondansetron (ZOFRAN) injection 4 mg (4 mg Intravenous Given 05/15/23 1406)  HYDROmorphone (DILAUDID) injection 0.5 mg (0.5 mg Intravenous Given 05/15/23 1437)  ketorolac (TORADOL) 15 MG/ML injection 15 mg (15 mg Intravenous Given 05/15/23 1437)  HYDROmorphone (DILAUDID) injection 0.5 mg (0.5 mg  Intravenous Given 05/15/23 1526)    ED Course/ Medical Decision Making/ A&P    Patient seen and examined. History obtained directly from patient.  Reviewed urgent care note and x-ray read, imaging not available.  Labs/EKG: Ordered CBC, CMP, lipase, UA, pregnancy. Imaging: CT renal protocol to evaluate for renal stone or other causes of flank pain.  Medications/Fluids: Ordered: Morphine, Zofran.   Most recent vital signs reviewed and are as follows: BP (!) 114/47 (BP Location: Right Arm)   Pulse 95   Temp 98.3 F (36.8 C)   Resp 16   Ht 5\' 6"  (1.676 m)   Wt 86.2 kg   LMP 05/08/2023   SpO2 100%   BMI 30.67 kg/m   Initial impression: Right-sided abdominal pain  2:44 PM Reassessment performed. Patient appears uncomfortable.  She is squatting at the bedside complaining of right flank pain that is radiating to the right groin.  Additional pain and nausea medication including IV Toradol ordered in case of possibility of renal colic.  Labs personally reviewed and interpreted including: CBC unremarkable; CMP with minimally elevated creatinine at 1.3, BUN 11, normal transaminases; lipase normal; pregnancy negative.  Reviewed pertinent lab work and imaging with patient at bedside. Questions answered.   Most current vital signs reviewed and are as follows: BP (!) 114/47 (BP Location: Right Arm)   Pulse 95   Temp 98.3 F (36.8 C)   Resp 16   Ht 5\' 6"  (1.676 m)   Wt 86.2 kg   LMP 05/08/2023   SpO2 100%   BMI 30.67 kg/m   Plan: Dilaudid, Phenergan, Toradol ordered.  3:26 PM Signout to [employer].                                    Medical Decision Making Amount and/or Complexity of Data Reviewed Labs: ordered. Radiology: ordered.  Risk Prescription drug management.   For this patient's complaint of abdominal pain, the following conditions were considered on the differential diagnosis: gastritis/PUD, enteritis/duodenitis, appendicitis,  cholelithiasis/cholecystitis, cholangitis, pancreatitis, ruptured viscus, colitis, diverticulitis, small/large bowel obstruction, proctitis, cystitis, pyelonephritis, ureteral colic, aortic dissection, aortic aneurysm. In women, ectopic pregnancy, pelvic inflammatory disease, ovarian cysts, and tubo-ovarian abscess were also considered. Atypical chest etiologies were also considered including ACS, PE, and pneumonia.  Pending imaging and symptom control.          Final Clinical Impression(s) / ED Diagnoses Final diagnoses:  Right flank pain    Rx / DC Orders ED Discharge  Orders     None         Renne Crigler, PA-C 05/15/23 1529    Terrilee Files, MD 05/15/23 [phone]

## 2023-05-16 ENCOUNTER — Observation Stay (HOSPITAL_COMMUNITY): Payer: 59 | Admitting: Certified Registered Nurse Anesthetist

## 2023-05-16 ENCOUNTER — Encounter (HOSPITAL_COMMUNITY): Payer: Self-pay | Admitting: Family Medicine

## 2023-05-16 ENCOUNTER — Observation Stay (HOSPITAL_COMMUNITY): Payer: 59

## 2023-05-16 ENCOUNTER — Encounter (HOSPITAL_COMMUNITY)
Admission: EM | Disposition: A | Discharge status: Home / Self Care | Payer: Self-pay | Source: Home / Self Care | Attending: Internal Medicine

## 2023-05-16 DIAGNOSIS — N858 Other specified noninflammatory disorders of uterus: Secondary | ICD-10-CM

## 2023-05-16 DIAGNOSIS — R109 Unspecified abdominal pain: Secondary | ICD-10-CM

## 2023-05-16 DIAGNOSIS — N1330 Unspecified hydronephrosis: Secondary | ICD-10-CM

## 2023-05-16 DIAGNOSIS — R52 Pain, unspecified: Secondary | ICD-10-CM

## 2023-05-16 HISTORY — PX: CYSTOSCOPY W/ URETERAL STENT PLACEMENT: SHX1429

## 2023-05-16 LAB — CBC WITH DIFFERENTIAL/PLATELET
Abs Immature Granulocytes: 0.01 10*3/uL (ref 0.00–0.07)
Basophils Absolute: 0 10*3/uL (ref 0.0–0.1)
Basophils Relative: 1 %
Eosinophils Absolute: 0.1 10*3/uL (ref 0.0–0.5)
Eosinophils Relative: 2 %
HCT: 36.9 % (ref 36.0–46.0)
Hemoglobin: 12.4 g/dL (ref 12.0–15.0)
Immature Granulocytes: 0 %
Lymphocytes Relative: 38 %
Lymphs Abs: 2.4 10*3/uL (ref 0.7–4.0)
MCH: 29.6 pg (ref 26.0–34.0)
MCHC: 33.6 g/dL (ref 30.0–36.0)
MCV: 88.1 fL (ref 80.0–100.0)
Monocytes Absolute: 0.6 10*3/uL (ref 0.1–1.0)
Monocytes Relative: 10 %
Neutro Abs: 3.2 10*3/uL (ref 1.7–7.7)
Neutrophils Relative %: 49 %
Platelets: 200 10*3/uL (ref 150–400)
RBC: 4.19 MIL/uL (ref 3.87–5.11)
RDW: 13.1 % (ref 11.5–15.5)
WBC: 6.4 10*3/uL (ref 4.0–10.5)
nRBC: 0 % (ref 0.0–0.2)

## 2023-05-16 LAB — BASIC METABOLIC PANEL
Anion gap: 8 (ref 5–15)
BUN: 16 mg/dL (ref 6–20)
CO2: 24 mmol/L (ref 22–32)
Calcium: 8.8 mg/dL — ABNORMAL LOW (ref 8.9–10.3)
Chloride: 105 mmol/L (ref 98–111)
Creatinine, Ser: 1.53 mg/dL — ABNORMAL HIGH (ref 0.44–1.00)
GFR, Estimated: 46 mL/min — ABNORMAL LOW (ref >60)
Glucose, Bld: 120 mg/dL — ABNORMAL HIGH (ref 70–99)
Potassium: 3.7 mmol/L (ref 3.5–5.1)
Sodium: 137 mmol/L (ref 135–145)

## 2023-05-16 SURGERY — CYSTOSCOPY, WITH RETROGRADE PYELOGRAM AND URETERAL STENT INSERTION
Anesthesia: General | Laterality: Right

## 2023-05-16 MED ORDER — LIDOCAINE 2% (20 MG/ML) 5 ML SYRINGE
INTRAMUSCULAR | Status: AC
  Filled 2023-05-16: qty 5

## 2023-05-16 MED ORDER — WATER FOR IRRIGATION, STERILE IR SOLN
Status: DC | PRN
  Administered 2023-05-16: 1000 mL

## 2023-05-16 MED ORDER — DEXAMETHASONE SODIUM PHOSPHATE 10 MG/ML IJ SOLN
INTRAMUSCULAR | Status: AC
  Filled 2023-05-16: qty 1

## 2023-05-16 MED ORDER — PROPOFOL 10 MG/ML IV BOLUS
INTRAVENOUS | Status: AC
Start: 2023-05-16 — End: ?
  Filled 2023-05-16: qty 20

## 2023-05-16 MED ORDER — ONDANSETRON HCL 4 MG/2ML IJ SOLN
INTRAMUSCULAR | Status: DC | PRN
  Administered 2023-05-16: 4 mg via INTRAVENOUS

## 2023-05-16 MED ORDER — CHLORHEXIDINE GLUCONATE 0.12 % MT SOLN
15.0000 mL | Freq: Once | OROMUCOSAL | Status: AC
Start: 2023-05-16 — End: 2023-05-16
  Administered 2023-05-16: 15 mL via OROMUCOSAL

## 2023-05-16 MED ORDER — FENTANYL CITRATE (PF) 100 MCG/2ML IJ SOLN: 50.0000 ug | Freq: Once | INTRAMUSCULAR | Status: AC

## 2023-05-16 MED ORDER — MIDAZOLAM HCL 2 MG/2ML IJ SOLN
INTRAMUSCULAR | Status: AC
  Filled 2023-05-16: qty 2

## 2023-05-16 MED ORDER — LIDOCAINE 2% (20 MG/ML) 5 ML SYRINGE
INTRAMUSCULAR | Status: DC | PRN
  Administered 2023-05-16: 60 mg via INTRAVENOUS

## 2023-05-16 MED ORDER — CHLORHEXIDINE GLUCONATE CLOTH 2 % EX PADS
6.0000 | MEDICATED_PAD | Freq: Every day | CUTANEOUS | Status: DC
  Administered 2023-05-17 – 2023-05-19 (×3): 6 via TOPICAL

## 2023-05-16 MED ORDER — PROPOFOL 10 MG/ML IV BOLUS
INTRAVENOUS | Status: DC | PRN
  Administered 2023-05-16: 200 mg via INTRAVENOUS

## 2023-05-16 MED ORDER — OXYCODONE HCL 5 MG PO TABS
10.0000 mg | ORAL_TABLET | ORAL | Status: AC
  Filled 2023-05-16 (×2): qty 2

## 2023-05-16 MED ORDER — ACETAMINOPHEN 650 MG RE SUPP
650.0000 mg | Freq: Four times a day (QID) | RECTAL | Status: DC | PRN
Start: 2023-05-16 — End: 2023-05-16

## 2023-05-16 MED ORDER — LACTATED RINGERS IV SOLN: INTRAVENOUS | Status: DC

## 2023-05-16 MED ORDER — ONDANSETRON HCL 4 MG/2ML IJ SOLN: 4.0000 mg | Freq: Four times a day (QID) | INTRAMUSCULAR | Status: DC | PRN

## 2023-05-16 MED ORDER — SODIUM CHLORIDE 0.9 % IV SOLN: INTRAVENOUS | Status: DC

## 2023-05-16 MED ORDER — ACETAMINOPHEN 325 MG PO TABS
650.0000 mg | ORAL_TABLET | Freq: Four times a day (QID) | ORAL | Status: DC
  Administered 2023-05-16 (×2): 650 mg via ORAL
  Filled 2023-05-16 (×2): qty 2

## 2023-05-16 MED ORDER — KETOROLAC TROMETHAMINE 30 MG/ML IJ SOLN
30.0000 mg | Freq: Four times a day (QID) | INTRAMUSCULAR | Status: DC
  Administered 2023-05-16: 30 mg via INTRAVENOUS
  Filled 2023-05-16: qty 1

## 2023-05-16 MED ORDER — ENOXAPARIN SODIUM 40 MG/0.4ML IJ SOSY
40.0000 mg | PREFILLED_SYRINGE | Freq: Every day | INTRAMUSCULAR | Status: DC
  Administered 2023-05-16 – 2023-05-18 (×3): 40 mg via SUBCUTANEOUS
  Filled 2023-05-16 (×3): qty 0.4

## 2023-05-16 MED ORDER — OXYCODONE HCL 5 MG PO TABS: 5.0000 mg | ORAL_TABLET | Freq: Once | ORAL | Status: DC | PRN

## 2023-05-16 MED ORDER — OXYCODONE HCL 5 MG/5ML PO SOLN: 5.0000 mg | Freq: Once | ORAL | Status: DC | PRN

## 2023-05-16 MED ORDER — IOHEXOL 300 MG/ML  SOLN
INTRAMUSCULAR | Status: DC | PRN
  Administered 2023-05-16: 25 mL

## 2023-05-16 MED ORDER — OXYCODONE HCL 5 MG PO TABS: 10.0000 mg | ORAL_TABLET | ORAL | Status: DC | PRN

## 2023-05-16 MED ORDER — ACETAMINOPHEN 325 MG PO TABS
650.0000 mg | ORAL_TABLET | Freq: Four times a day (QID) | ORAL | Status: DC | PRN
Start: 2023-05-16 — End: 2023-05-16

## 2023-05-16 MED ORDER — ACETAMINOPHEN 500 MG PO TABS
1000.0000 mg | ORAL_TABLET | Freq: Four times a day (QID) | ORAL | Status: DC
  Administered 2023-05-16 – 2023-05-17 (×3): 1000 mg via ORAL
  Administered 2023-05-17: 500 mg via ORAL
  Administered 2023-05-17 – 2023-05-19 (×7): 1000 mg via ORAL
  Filled 2023-05-16 (×11): qty 2

## 2023-05-16 MED ORDER — FENTANYL CITRATE (PF) 250 MCG/5ML IJ SOLN
INTRAMUSCULAR | Status: DC | PRN
  Administered 2023-05-16: 100 ug via INTRAVENOUS
  Administered 2023-05-16: 50 ug via INTRAVENOUS

## 2023-05-16 MED ORDER — KETOROLAC TROMETHAMINE 30 MG/ML IJ SOLN: 30.0000 mg | Freq: Four times a day (QID) | INTRAMUSCULAR | Status: DC

## 2023-05-16 MED ORDER — MIDAZOLAM HCL 2 MG/2ML IJ SOLN
INTRAMUSCULAR | Status: DC | PRN
  Administered 2023-05-16: 2 mg via INTRAVENOUS

## 2023-05-16 MED ORDER — POLYETHYLENE GLYCOL 3350 17 G PO PACK
17.0000 g | PACK | Freq: Every day | ORAL | Status: DC
  Administered 2023-05-18: 17 g via ORAL
  Filled 2023-05-16: qty 1

## 2023-05-16 MED ORDER — ONDANSETRON HCL 4 MG/2ML IJ SOLN
INTRAMUSCULAR | Status: AC
  Filled 2023-05-16: qty 2

## 2023-05-16 MED ORDER — HYDROMORPHONE HCL 1 MG/ML IJ SOLN
2.0000 mg | INTRAMUSCULAR | Status: DC | PRN
  Administered 2023-05-16 – 2023-05-19 (×6): 2 mg via INTRAVENOUS
  Filled 2023-05-16 (×6): qty 2

## 2023-05-16 MED ORDER — FENTANYL CITRATE (PF) 100 MCG/2ML IJ SOLN: 25.0000 ug | INTRAMUSCULAR | Status: DC | PRN

## 2023-05-16 MED ORDER — ORAL CARE MOUTH RINSE: 15.0000 mL | Freq: Once | OROMUCOSAL | Status: AC

## 2023-05-16 MED ORDER — FENTANYL CITRATE (PF) 250 MCG/5ML IJ SOLN
INTRAMUSCULAR | Status: AC
  Filled 2023-05-16: qty 5

## 2023-05-16 MED ORDER — SODIUM CHLORIDE 0.9 % IV SOLN
1.0000 g | Freq: Once | INTRAVENOUS | Status: DC
  Filled 2023-05-16 (×2): qty 10

## 2023-05-16 MED ORDER — DEXAMETHASONE SODIUM PHOSPHATE 10 MG/ML IJ SOLN
INTRAMUSCULAR | Status: DC | PRN
  Administered 2023-05-16: 10 mg via INTRAVENOUS

## 2023-05-16 MED ORDER — GUAIFENESIN 100 MG/5ML PO LIQD: 5.0000 mL | ORAL | Status: DC | PRN

## 2023-05-16 MED ORDER — FENTANYL CITRATE (PF) 100 MCG/2ML IJ SOLN
INTRAMUSCULAR | Status: AC
  Administered 2023-05-16: 50 ug via INTRAVENOUS
  Filled 2023-05-16: qty 2

## 2023-05-16 MED ORDER — CHLORHEXIDINE GLUCONATE 0.12 % MT SOLN
OROMUCOSAL | Status: AC
  Filled 2023-05-16: qty 15

## 2023-05-16 MED ORDER — SENNOSIDES-DOCUSATE SODIUM 8.6-50 MG PO TABS: 2.0000 | ORAL_TABLET | Freq: Every evening | ORAL | Status: DC | PRN

## 2023-05-16 SURGICAL SUPPLY — 15 items
BAG URINE DRAIN 2000ML AR STRL (UROLOGICAL SUPPLIES) ×1 IMPLANT
BAG URO CATCHER STRL LF (MISCELLANEOUS) ×1 IMPLANT
CATH FOLEY 2WAY SLVR 5CC 16FR (CATHETERS) IMPLANT
CATH URETL OPEN END 6FR 70 (CATHETERS) ×1 IMPLANT
GLOVE BIO SURGEON STRL SZ8 (GLOVE) ×1 IMPLANT
GOWN STRL REUS W/ TWL XL LVL3 (GOWN DISPOSABLE) ×1 IMPLANT
GOWN STRL REUS W/TWL XL LVL3 (GOWN DISPOSABLE) ×2
GUIDEWIRE STR DUAL SENSOR (WIRE) ×1 IMPLANT
KIT TURNOVER KIT B (KITS) ×1 IMPLANT
MANIFOLD NEPTUNE II (INSTRUMENTS) IMPLANT
PACK CYSTO (CUSTOM PROCEDURE TRAY) ×1 IMPLANT
STENT URET 6FRX24 CONTOUR (STENTS) IMPLANT
TOWEL GREEN STERILE FF (TOWEL DISPOSABLE) ×1 IMPLANT
TUBE CONNECTING 12X1/4 (SUCTIONS) IMPLANT
WATER STERILE IRR 3000ML UROMA (IV SOLUTION) ×1 IMPLANT

## 2023-05-16 NOTE — ED Notes (Signed)
ED TO INPATIENT HANDOFF REPORT  ED Nurse Name and Phone #: France Ravens [phone]  S Name/Age/Gender Lisa Herring 32 y.o. female Room/Bed: 012C/012C  Code Status   Code Status: Full Code  Home/SNF/Other Home Patient oriented to: self, place, time, and situation Is this baseline? Yes   Triage Complete: Triage complete  Chief Complaint Inadequate pain control [R52]  Triage Note PT arrives via POV from home with back pain abdominal pain and vomited multiple times since Wednesday. Seen at urgent care yesterday states symptoms worsened last night. Now doubled over in pain in triage.    Allergies No Known Allergies  Level of Care/Admitting Diagnosis ED Disposition     ED Disposition  Admit   Condition  --   Comment  Hospital Area: [facility] [[id number]]  Level of Care: Med-Surg [16]  May place patient in observation at [facility] or [facility] if equivalent level of care is available:: Yes  Covid Evaluation: Asymptomatic - no recent exposure (last 10 days) testing not required  Diagnosis: Inadequate pain control [720056]  Admitting Physician: Gery Pray [[id number]]  Attending Physician: Gery Pray [[id number]]          B Medical/Surgery History History reviewed. No pertinent past medical history. History reviewed. No pertinent surgical history.   A IV Location/Drains/Wounds Patient Lines/Drains/Airways Status     Active Line/Drains/Airways     Name Placement date Placement time Site Days   Peripheral IV 05/15/23 20 G Right Antecubital 05/15/23  1405  Antecubital  1            Intake/Output Last 24 hours  Intake/Output Summary (Last 24 hours) at 05/16/2023 0149 Last data filed at 05/15/2023 2102 Gross per 24 hour  Intake 48.64 ml  Output --  Net 48.64 ml    Labs/Imaging Results for orders placed or performed during the hospital encounter of 05/15/23 (from the past 48 hour(s))  Lipase, blood     Status: None   Collection Time: 05/15/23   1:24 PM  Result Value Ref Range   Lipase 32 11 - 51 U/L    Comment: Performed at [facility], [address] [place], [place] [address]  Comprehensive metabolic panel     Status: Abnormal   Collection Time: 05/15/23  1:24 PM  Result Value Ref Range   Sodium 138 135 - 145 mmol/L   Potassium 3.8 3.5 - 5.1 mmol/L   Chloride 104 98 - 111 mmol/L   CO2 23 22 - 32 mmol/L   Glucose, Bld 106 (H) 70 - 99 mg/dL    Comment: Glucose reference range applies only to samples taken after fasting for at least 8 hours.   BUN 11 6 - 20 mg/dL   Creatinine, Ser 4.78 (H) 0.44 - 1.00 mg/dL   Calcium 9.7 8.9 - 29.5 mg/dL   Total Protein 7.6 6.5 - 8.1 g/dL   Albumin 3.9 3.5 - 5.0 g/dL   AST 21 15 - 41 U/L   ALT 27 0 - 44 U/L   Alkaline Phosphatase 58 38 - 126 U/L   Total Bilirubin 0.6 <1.2 mg/dL   GFR, Estimated 56 (L) >60 mL/min    Comment: (NOTE) Calculated using the CKD-EPI Creatinine Equation (2021)    Anion gap 11 5 - 15    Comment: Performed at [facility], [address], [place], [place] [address]  CBC     Status: None   Collection Time: 05/15/23  1:24 PM  Result Value Ref Range  WBC 5.6 4.0 - 10.5 K/uL   RBC 5.00 3.87 - 5.11 MIL/uL   Hemoglobin 14.6 12.0 - 15.0 g/dL   HCT 52.8 41.3 - 24.4 %   MCV 87.2 80.0 - 100.0 fL   MCH 29.2 26.0 - 34.0 pg   MCHC 33.5 30.0 - 36.0 g/dL   RDW 01.0 27.2 - 53.6 %   Platelets 240 150 - 400 K/uL   nRBC 0.0 0.0 - 0.2 %    Comment: Performed at [facility], [address], [place], [place] [address]  hCG, serum, qualitative     Status: None   Collection Time: 05/15/23  1:24 PM  Result Value Ref Range   Preg, Serum NEGATIVE NEGATIVE    Comment:        THE SENSITIVITY OF THIS METHODOLOGY IS >10 mIU/mL. Performed at [facility], [address], [place], [place] [address]   Urinalysis, Routine w reflex microscopic -Urine, Clean Catch     Status: None   Collection Time: 05/15/23  9:01 PM  Result Value Ref Range   Color, Urine  YELLOW YELLOW   APPearance CLEAR CLEAR   Specific Gravity, Urine 1.018 1.005 - 1.030   pH 6.0 5.0 - 8.0   Glucose, UA NEGATIVE NEGATIVE mg/dL   Hgb urine dipstick NEGATIVE NEGATIVE   Bilirubin Urine NEGATIVE NEGATIVE   Ketones, ur NEGATIVE NEGATIVE mg/dL   Protein, ur NEGATIVE NEGATIVE mg/dL   Nitrite NEGATIVE NEGATIVE   Leukocytes,Ua NEGATIVE NEGATIVE    Comment: Performed at [facility], [address], [place], [place] [address]   US Pelvis Complete  Result Date: 05/15/2023 CLINICAL DATA:  Right flank pain, enlarged uterus on CT EXAM: TRANSABDOMINAL ULTRASOUND OF PELVIS DOPPLER ULTRASOUND OF OVARIES TECHNIQUE: Transabdominal ultrasound examination of the pelvis was performed including evaluation of the uterus, ovaries, adnexal regions, and pelvic cul-de-sac. Color and duplex Doppler ultrasound was utilized to evaluate blood flow to the ovaries. COMPARISON:  05/15/2023 FINDINGS: Uterus Measurements: 18.3 x 10.9 x 15.5 cm. The uterus is diffusely heterogeneous. No discrete fibroids are visualized on transabdominal imaging. Patient could not tolerate endovaginal exam due to discomfort. Endometrium The endometrial stripe is not well visualized. Right ovary Measurements: 5.1 x 2.3 x 2.3 cm = volume: 14.2 mL. Normal appearance/no adnexal mass. Left ovary Measurements: 3.5 x 1.4 x 2.3 cm = volume: 5.7 mL. Normal appearance/no adnexal mass. Pulsed Doppler evaluation demonstrates normal low-resistance arterial and venous waveforms in both ovaries. Other: No free fluid. IMPRESSION: 1. Markedly enlarged uterus, which demonstrates diffusely heterogeneous echotexture but no well-defined masses. The endometrial stripe is not well visualized. Given the lack of visible fibroids and the nonvisualization of the endometrium, further evaluation with nonemergent pelvic MRI as well as gynecologic consultation may be useful to exclude underlying endometrial or uterine neoplasm. 2. Unremarkable appearance of the  ovaries.  No evidence of torsion. Electronically Signed   By: Sharlet Salina M.D.   On: 05/15/2023 18:48   [place] Art/Ven Flow Abd Pelv Doppler  Result Date: 05/15/2023 CLINICAL DATA:  Right flank pain, enlarged uterus on CT EXAM: TRANSABDOMINAL ULTRASOUND OF PELVIS DOPPLER ULTRASOUND OF OVARIES TECHNIQUE: Transabdominal ultrasound examination of the pelvis was performed including evaluation of the uterus, ovaries, adnexal regions, and pelvic cul-de-sac. Color and duplex Doppler ultrasound was utilized to evaluate blood flow to the ovaries. COMPARISON:  05/15/2023 FINDINGS: Uterus Measurements: 18.3 x 10.9 x 15.5 cm. The uterus is diffusely heterogeneous. No discrete fibroids are visualized on transabdominal imaging. Patient  could not tolerate endovaginal exam due to discomfort. Endometrium The endometrial stripe is not well visualized. Right ovary Measurements: 5.1 x 2.3 x 2.3 cm = volume: 14.2 mL. Normal appearance/no adnexal mass. Left ovary Measurements: 3.5 x 1.4 x 2.3 cm = volume: 5.7 mL. Normal appearance/no adnexal mass. Pulsed Doppler evaluation demonstrates normal low-resistance arterial and venous waveforms in both ovaries. Other: No free fluid. IMPRESSION: 1. Markedly enlarged uterus, which demonstrates diffusely heterogeneous echotexture but no well-defined masses. The endometrial stripe is not well visualized. Given the lack of visible fibroids and the nonvisualization of the endometrium, further evaluation with nonemergent pelvic MRI as well as gynecologic consultation may be useful to exclude underlying endometrial or uterine neoplasm. 2. Unremarkable appearance of the ovaries.  No evidence of torsion. Electronically Signed   By: Sharlet Salina M.D.   On: 05/15/2023 18:48   CT Renal Stone Study  Result Date: 05/15/2023 CLINICAL DATA:  Right-sided abdominal and flank pain for several days. Vomiting. EXAM: CT ABDOMEN AND PELVIS WITHOUT CONTRAST TECHNIQUE: Multidetector CT imaging of the abdomen  and pelvis was performed following the standard protocol without IV contrast. RADIATION DOSE REDUCTION: This exam was performed according to the departmental dose-optimization program which includes automated exposure control, adjustment of the mA and/or kV according to patient size and/or use of iterative reconstruction technique. COMPARISON:  None Available. FINDINGS: Lower chest: No acute findings. Hepatobiliary: No mass visualized on this unenhanced exam. Gallbladder is unremarkable. No evidence of biliary ductal dilatation. Pancreas: No mass or inflammatory process visualized on this unenhanced exam. Spleen:  Within normal limits in size. Adrenals/Urinary tract: Mild to moderate right hydroureteronephrosis is seen no ureteral calculi identified. This appears to be due to mass effect on the distal right ureter by markedly enlarged uterus. Stomach/Bowel: No evidence of obstruction, inflammatory process, or abnormal fluid collections. Normal appendix visualized. Vascular/Lymphatic: No pathologically enlarged lymph nodes identified. No evidence of abdominal aortic aneurysm. Reproductive: Markedly enlarged uterus, likely due to fibroids. Adnexal regions are unremarkable. Tiny amount of free fluid noted in pelvis. Other:  None. Musculoskeletal:  No suspicious bone lesions identified. IMPRESSION: Markedly enlarged uterus, likely due to fibroids. Mild to moderate right hydroureteronephrosis, due to mass effect on the distal right ureter by the markedly enlarged uterus. No ureteral calculi identified. Electronically Signed   By: Danae Orleans M.D.   On: 05/15/2023 16:20    Pending Labs Unresulted Labs (From admission, onward)     Start     Ordered   05/23/23 0500  Creatinine, serum  (enoxaparin (LOVENOX)    CrCl < 30 ml/min)  Once,   R       Comments: while on enoxaparin therapy.    05/16/23 0127   05/16/23 0500  Basic metabolic panel  Tomorrow morning,   R        05/16/23 0127   05/16/23 0500  CBC with  Differential/Platelet  Tomorrow morning,   R        05/16/23 0127            Vitals/Pain Today's Vitals   05/16/23 0045 05/16/23 0103 05/16/23 0130 05/16/23 0142  BP: 114/76  109/60   Pulse: 67  62   Resp: 13     Temp:    98.4 F (36.9 C)  TempSrc:    Oral  SpO2: 96%  96%   Weight:      Height:      PainSc:  2       Isolation Precautions No active isolations  Medications Medications  promethazine (PHENERGAN) 12.5 mg in sodium chloride 0.9 % 50 mL IVPB (0 mg Intravenous Stopped 05/15/23 1956)  guaiFENesin (ROBITUSSIN) 100 MG/5ML liquid 5 mL (has no administration in time range)  acetaminophen (TYLENOL) tablet 650 mg (650 mg Oral Given 05/16/23 0103)  HYDROmorphone (DILAUDID) injection 2 mg (has no administration in time range)  enoxaparin (LOVENOX) injection 40 mg (has no administration in time range)  acetaminophen (TYLENOL) tablet 650 mg (has no administration in time range)    Or  acetaminophen (TYLENOL) suppository 650 mg (has no administration in time range)  oxyCODONE (Oxy IR/ROXICODONE) immediate release tablet 10 mg (10 mg Oral Not Given 05/16/23 0142)  morphine (PF) 4 MG/ML injection 4 mg (4 mg Intravenous Given 05/15/23 1405)  ondansetron (ZOFRAN) injection 4 mg (4 mg Intravenous Given 05/15/23 1406)  HYDROmorphone (DILAUDID) injection 0.5 mg (0.5 mg Intravenous Given 05/15/23 1437)  ketorolac (TORADOL) 15 MG/ML injection 15 mg (15 mg Intravenous Given 05/15/23 1437)  HYDROmorphone (DILAUDID) injection 0.5 mg (0.5 mg Intravenous Given 05/15/23 1526)  HYDROmorphone (DILAUDID) injection 1 mg (1 mg Intravenous Given 05/15/23 1630)  HYDROmorphone (DILAUDID) injection 1 mg (1 mg Intravenous Given 05/15/23 1852)  HYDROcodone-acetaminophen (NORCO/VICODIN) 5-325 MG per tablet 2 tablet (2 tablets Oral Given 05/15/23 2256)    Mobility walks     Focused Assessments    R Recommendations: See Admitting Provider Note  Report given to:   Additional Notes:   Declined last dose of oxycodone, states pain has improved some

## 2023-05-16 NOTE — Progress Notes (Signed)
Patient admitted after midnight, please see H&P.  Here with large uterine fibroid causing AKI and hydronephrosis.  Patient follows with Dr. Sallye Ober.  Will need follow up for fibroid removal-- to be seen in next 2 weeks.  Discussed with covering Dr. Richardson Dopp.  Seen by Dr. Margo Aye with plans for stent either today or in the AM. Marlin Canary DO

## 2023-05-16 NOTE — Anesthesia Procedure Notes (Signed)
Procedure Name: LMA Insertion Date/Time: 05/16/2023 1:07 PM  Performed by: Darryl Nestle, CRNAPre-anesthesia Checklist: Patient identified, Emergency Drugs available, Suction available and Patient being monitored Patient Re-evaluated:Patient Re-evaluated prior to induction Oxygen Delivery Method: Circle system utilized Preoxygenation: Pre-oxygenation with 100% oxygen Induction Type: IV induction Ventilation: Mask ventilation without difficulty LMA: LMA inserted LMA Size: 4.0 Tube type: Oral Number of attempts: 1 Placement Confirmation: positive ETCO2 and breath sounds checked- equal and bilateral Tube secured with: Tape Dental Injury: Teeth and Oropharynx as per pre-operative assessment

## 2023-05-16 NOTE — Anesthesia Preprocedure Evaluation (Signed)
Anesthesia Evaluation  Patient identified by MRN, date of birth, ID band Patient awake    Reviewed: Allergy & Precautions, H&P , NPO status , Patient's Chart, lab work & pertinent test results  Airway Mallampati: II   Neck ROM: full    Dental   Pulmonary neg pulmonary ROS   breath sounds clear to auscultation       Cardiovascular negative cardio ROS  Rhythm:regular Rate:Normal     Neuro/Psych    GI/Hepatic   Endo/Other    Renal/GU ARFRenal diseaseHydronephrosis  Cr 1.53     Musculoskeletal   Abdominal   Peds  Hematology   Anesthesia Other Findings   Reproductive/Obstetrics                             Anesthesia Physical Anesthesia Plan  ASA: 2  Anesthesia Plan: General   Post-op Pain Management:    Induction: Intravenous  PONV Risk Score and Plan: 3 and Ondansetron, Dexamethasone, Midazolam and Treatment may vary due to age or medical condition  Airway Management Planned: LMA  Additional Equipment:   Intra-op Plan:   Post-operative Plan: Extubation in OR  Informed Consent: I have reviewed the patients History and Physical, chart, labs and discussed the procedure including the risks, benefits and alternatives for the proposed anesthesia with the patient or authorized representative who has indicated his/her understanding and acceptance.     Dental advisory given  Plan Discussed with: CRNA, Anesthesiologist and Surgeon  Anesthesia Plan Comments:        Anesthesia Quick Evaluation

## 2023-05-16 NOTE — Op Note (Signed)
OPERATIVE NOTE   Patient Name: Lisa Herring  MRN: [id number]   Date of Procedure: 05/16/2023   Preoperative diagnosis:  Right sided flank pain with hydronephrosis Marked uterine enlargement with suspected ureteral compression and obstruction  Postoperative diagnosis:  Same  Procedure:  Cystoscopy, right retrograde ureteropyelogram with intraoperative interpretation, placement of right 6 x 24 cm double-J ureteral stent-tether removed  Attending: Marcha Solders, MD  Anesthesia: General  Estimated blood loss: Minimal  Fluids: Per anesthesia record  Drains: Right 6 x 24 cm double-J stent-tether removed; Foley catheter  Specimens: None  Antibiotics: Rocephin 1 g IV  Findings: Marked extrinsic bladder compression from uterus with bladder deformity and right ureteral orifice proximal and laterally deviated. Marked proximal ureteral and intrarenal hydro  Indications:  Right flank pain with hydronephrosis  Description of Procedure:  Patient was brought to the operating room where she was correctly identified.  Patient then underwent general anesthesia via LMA.  Patient was subsequently positioned in the modified dorsolithotomy position prepped and draped in the usual sterile fashion.  A 22 French panendoscope was subsequently inserted per urethra and the bladder was carefully inspected.  There is noted to be marked posterior bladder extrinsic compression and some bladder deformity with the right ureteral orifice located proximal and lateral to the usual trigone position.  The left ureteral orifice was of normal configuration and location.  There were no focal mucosal lesions.  A right retrograde ureteropyelogram was subsequently performed that showed a normal distal ureter up to the level of the presumed extrinsic compression on the ureter where there was a transition and then fairly significant proximal ureteral dilation and severe hydronephrosis.  At this time a safety wire  was placed under endoscopic and fluoroscopic control up to the renal collecting system.  Over the wire then placed under endoscopic and fluoroscopic control a 6 x 24 cm double-J ureteral stent with tether removed.  The stent placement went quite easily and good position was obtained proximally and distally verified by fluoroscopy.  To maximize drainage after removing the scope I also placed a Foley catheter which we will plan to leave in at least until tomorrow morning when we recheck her serum creatinine.  The patient tolerated the procedure well.  There were no apparent complications.  Complications: None  Condition: Stable, extubated, transferred to PACU  Plan: leave Foley in for max drainage at least until am.  Quite severe hydro noted.  I suspect gyn will leave stent in until surgical mgmt of uterus.

## 2023-05-16 NOTE — Consult Note (Signed)
Urology Consult   Physician requesting consult: Dr. Deatra Robinson  Reason for consult: right flank pain, hydronephrosis  History of Present Illness: Lisa Herring is a 32 y.o. female; no prior children with h/o 5 to 6 days of right-sided flank pain that occasionally radiates into her right lower quadrant and sometimes to her hips bilaterally.  She was seen in urgent care several days ago and was told that she was constipated.  Her pain has increased significantly and she presented to the ER yesterday. CT renal stone study showed a markedly enlarged uterus likely due to fibroids with mild to moderate right hydro likely due to mass effect on the distal ureter by the markedly enlarged uterus.  The patient was also noted to have azotemia with a creatinine on admission of 1.3.  No prior labs to know what her baseline is.  Today her creatinine is increased to 1.5.  This morning she feels better after her current pain management.  She denies any nausea or vomiting.   History reviewed. No pertinent past medical history.  History reviewed. No pertinent surgical history.  Medications:  Home meds:    Scheduled Meds:  acetaminophen  1,000 mg Oral Q6H   enoxaparin (LOVENOX) injection  40 mg Subcutaneous Daily   oxyCODONE  10 mg Oral Q4H   Continuous Infusions:  sodium chloride     promethazine (PHENERGAN) injection (IM or IVPB) Stopped (05/15/23 1946)   PRN Meds:.guaiFENesin, HYDROmorphone (DILAUDID) injection, promethazine (PHENERGAN) injection (IM or IVPB)  Allergies: No Known Allergies  History reviewed. No pertinent family history.  Social History:  reports that she has never smoked. She has never used smokeless tobacco. She reports that she does not drink alcohol and does not use drugs.  ROS: A complete review of systems was performed.  All systems are negative except for pertinent findings as noted.  Physical Exam:  Vital signs in last 24 hours: Temp:  [97.5 F (36.4 C)-98.6 F (37  C)] 98.6 F (37 C) (11/17 0723) Pulse Rate:  [62-95] 72 (11/17 0723) Resp:  [10-23] 16 (11/17 0723) BP: (104-146)/(47-95) 105/69 (11/17 0723) SpO2:  [90 %-100 %] 97 % (11/17 0723) Weight:  [86.2 kg] 86.2 kg (11/16 1315) Constitutional:  Alert and oriented, No acute distress COR: RR LUNGS: CTA ABD:  soft nt/nd EXT:  no edema Neurol:  intact  Laboratory Data:  Recent Labs    05/15/23 1324 05/16/23 0226  WBC 5.6 6.4  HGB 14.6 12.4  HCT 43.6 36.9  PLT 240 200    Recent Labs    05/15/23 1324 05/16/23 0226  NA 138 137  K 3.8 3.7  CL 104 105  GLUCOSE 106* 120*  BUN 11 16  CALCIUM 9.7 8.8*  CREATININE 1.30* 1.53*     Results for orders placed or performed during the hospital encounter of 05/15/23 (from the past 24 hour(s))  Lipase, blood     Status: None   Collection Time: 05/15/23  1:24 PM  Result Value Ref Range   Lipase 32 11 - 51 U/L  Comprehensive metabolic panel     Status: Abnormal   Collection Time: 05/15/23  1:24 PM  Result Value Ref Range   Sodium 138 135 - 145 mmol/L   Potassium 3.8 3.5 - 5.1 mmol/L   Chloride 104 98 - 111 mmol/L   CO2 23 22 - 32 mmol/L   Glucose, Bld 106 (H) 70 - 99 mg/dL   BUN 11 6 - 20 mg/dL   Creatinine, Ser 1.61 (H) 0.44 -  1.00 mg/dL   Calcium 9.7 8.9 - 29.5 mg/dL   Total Protein 7.6 6.5 - 8.1 g/dL   Albumin 3.9 3.5 - 5.0 g/dL   AST 21 15 - 41 U/L   ALT 27 0 - 44 U/L   Alkaline Phosphatase 58 38 - 126 U/L   Total Bilirubin 0.6 <1.2 mg/dL   GFR, Estimated 56 (L) >60 mL/min   Anion gap 11 5 - 15  CBC     Status: None   Collection Time: 05/15/23  1:24 PM  Result Value Ref Range   WBC 5.6 4.0 - 10.5 K/uL   RBC 5.00 3.87 - 5.11 MIL/uL   Hemoglobin 14.6 12.0 - 15.0 g/dL   HCT 62.1 30.8 - 65.7 %   MCV 87.2 80.0 - 100.0 fL   MCH 29.2 26.0 - 34.0 pg   MCHC 33.5 30.0 - 36.0 g/dL   RDW 84.6 96.2 - 95.2 %   Platelets 240 150 - 400 K/uL   nRBC 0.0 0.0 - 0.2 %  hCG, serum, qualitative     Status: None   Collection Time: 05/15/23   1:24 PM  Result Value Ref Range   Preg, Serum NEGATIVE NEGATIVE  Urinalysis, Routine w reflex microscopic -Urine, Clean Catch     Status: None   Collection Time: 05/15/23  9:01 PM  Result Value Ref Range   Color, Urine YELLOW YELLOW   APPearance CLEAR CLEAR   Specific Gravity, Urine 1.018 1.005 - 1.030   pH 6.0 5.0 - 8.0   Glucose, UA NEGATIVE NEGATIVE mg/dL   Hgb urine dipstick NEGATIVE NEGATIVE   Bilirubin Urine NEGATIVE NEGATIVE   Ketones, ur NEGATIVE NEGATIVE mg/dL   Protein, ur NEGATIVE NEGATIVE mg/dL   Nitrite NEGATIVE NEGATIVE   Leukocytes,Ua NEGATIVE NEGATIVE  Basic metabolic panel     Status: Abnormal   Collection Time: 05/16/23  2:26 AM  Result Value Ref Range   Sodium 137 135 - 145 mmol/L   Potassium 3.7 3.5 - 5.1 mmol/L   Chloride 105 98 - 111 mmol/L   CO2 24 22 - 32 mmol/L   Glucose, Bld 120 (H) 70 - 99 mg/dL   BUN 16 6 - 20 mg/dL   Creatinine, Ser 8.41 (H) 0.44 - 1.00 mg/dL   Calcium 8.8 (L) 8.9 - 10.3 mg/dL   GFR, Estimated 46 (L) >60 mL/min   Anion gap 8 5 - 15  CBC with Differential/Platelet     Status: None   Collection Time: 05/16/23  2:26 AM  Result Value Ref Range   WBC 6.4 4.0 - 10.5 K/uL   RBC 4.19 3.87 - 5.11 MIL/uL   Hemoglobin 12.4 12.0 - 15.0 g/dL   HCT 32.4 40.1 - 02.7 %   MCV 88.1 80.0 - 100.0 fL   MCH 29.6 26.0 - 34.0 pg   MCHC 33.6 30.0 - 36.0 g/dL   RDW 25.3 66.4 - 40.3 %   Platelets 200 150 - 400 K/uL   nRBC 0.0 0.0 - 0.2 %   Neutrophils Relative % 49 %   Neutro Abs 3.2 1.7 - 7.7 K/uL   Lymphocytes Relative 38 %   Lymphs Abs 2.4 0.7 - 4.0 K/uL   Monocytes Relative 10 %   Monocytes Absolute 0.6 0.1 - 1.0 K/uL   Eosinophils Relative 2 %   Eosinophils Absolute 0.1 0.0 - 0.5 K/uL   Basophils Relative 1 %   Basophils Absolute 0.0 0.0 - 0.1 K/uL   Immature Granulocytes 0 %  Abs Immature Granulocytes 0.01 0.00 - 0.07 K/uL   No results found for this or any previous visit (from the past 240 hour(s)).  Renal Function: Recent Labs     05/15/23 1324 05/16/23 0226  CREATININE 1.30* 1.53*   Estimated Creatinine Clearance: 58.4 mL/min (A) (by C-G formula based on SCr of 1.53 mg/dL (H)).  Radiologic Imaging: US Pelvis Complete  Result Date: 05/15/2023 CLINICAL DATA:  Right flank pain, enlarged uterus on CT EXAM: TRANSABDOMINAL ULTRASOUND OF PELVIS DOPPLER ULTRASOUND OF OVARIES TECHNIQUE: Transabdominal ultrasound examination of the pelvis was performed including evaluation of the uterus, ovaries, adnexal regions, and pelvic cul-de-sac. Color and duplex Doppler ultrasound was utilized to evaluate blood flow to the ovaries. COMPARISON:  05/15/2023 FINDINGS: Uterus Measurements: 18.3 x 10.9 x 15.5 cm. The uterus is diffusely heterogeneous. No discrete fibroids are visualized on transabdominal imaging. Patient could not tolerate endovaginal exam due to discomfort. Endometrium The endometrial stripe is not well visualized. Right ovary Measurements: 5.1 x 2.3 x 2.3 cm = volume: 14.2 mL. Normal appearance/no adnexal mass. Left ovary Measurements: 3.5 x 1.4 x 2.3 cm = volume: 5.7 mL. Normal appearance/no adnexal mass. Pulsed Doppler evaluation demonstrates normal low-resistance arterial and venous waveforms in both ovaries. Other: No free fluid. IMPRESSION: 1. Markedly enlarged uterus, which demonstrates diffusely heterogeneous echotexture but no well-defined masses. The endometrial stripe is not well visualized. Given the lack of visible fibroids and the nonvisualization of the endometrium, further evaluation with nonemergent pelvic MRI as well as gynecologic consultation may be useful to exclude underlying endometrial or uterine neoplasm. 2. Unremarkable appearance of the ovaries.  No evidence of torsion. Electronically Signed   By: Sharlet Salina M.D.   On: 05/15/2023 18:48   [place] Art/Ven Flow Abd Pelv Doppler  Result Date: 05/15/2023 CLINICAL DATA:  Right flank pain, enlarged uterus on CT EXAM: TRANSABDOMINAL ULTRASOUND OF PELVIS DOPPLER  ULTRASOUND OF OVARIES TECHNIQUE: Transabdominal ultrasound examination of the pelvis was performed including evaluation of the uterus, ovaries, adnexal regions, and pelvic cul-de-sac. Color and duplex Doppler ultrasound was utilized to evaluate blood flow to the ovaries. COMPARISON:  05/15/2023 FINDINGS: Uterus Measurements: 18.3 x 10.9 x 15.5 cm. The uterus is diffusely heterogeneous. No discrete fibroids are visualized on transabdominal imaging. Patient could not tolerate endovaginal exam due to discomfort. Endometrium The endometrial stripe is not well visualized. Right ovary Measurements: 5.1 x 2.3 x 2.3 cm = volume: 14.2 mL. Normal appearance/no adnexal mass. Left ovary Measurements: 3.5 x 1.4 x 2.3 cm = volume: 5.7 mL. Normal appearance/no adnexal mass. Pulsed Doppler evaluation demonstrates normal low-resistance arterial and venous waveforms in both ovaries. Other: No free fluid. IMPRESSION: 1. Markedly enlarged uterus, which demonstrates diffusely heterogeneous echotexture but no well-defined masses. The endometrial stripe is not well visualized. Given the lack of visible fibroids and the nonvisualization of the endometrium, further evaluation with nonemergent pelvic MRI as well as gynecologic consultation may be useful to exclude underlying endometrial or uterine neoplasm. 2. Unremarkable appearance of the ovaries.  No evidence of torsion. Electronically Signed   By: Sharlet Salina M.D.   On: 05/15/2023 18:48   CT Renal Stone Study  Result Date: 05/15/2023 CLINICAL DATA:  Right-sided abdominal and flank pain for several days. Vomiting. EXAM: CT ABDOMEN AND PELVIS WITHOUT CONTRAST TECHNIQUE: Multidetector CT imaging of the abdomen and pelvis was performed following the standard protocol without IV contrast. RADIATION DOSE REDUCTION: This exam was performed according to the departmental dose-optimization program which includes automated exposure control, adjustment of the  mA and/or kV according to  patient size and/or use of iterative reconstruction technique. COMPARISON:  None Available. FINDINGS: Lower chest: No acute findings. Hepatobiliary: No mass visualized on this unenhanced exam. Gallbladder is unremarkable. No evidence of biliary ductal dilatation. Pancreas: No mass or inflammatory process visualized on this unenhanced exam. Spleen:  Within normal limits in size. Adrenals/Urinary tract: Mild to moderate right hydroureteronephrosis is seen no ureteral calculi identified. This appears to be due to mass effect on the distal right ureter by markedly enlarged uterus. Stomach/Bowel: No evidence of obstruction, inflammatory process, or abnormal fluid collections. Normal appendix visualized. Vascular/Lymphatic: No pathologically enlarged lymph nodes identified. No evidence of abdominal aortic aneurysm. Reproductive: Markedly enlarged uterus, likely due to fibroids. Adnexal regions are unremarkable. Tiny amount of free fluid noted in pelvis. Other:  None. Musculoskeletal:  No suspicious bone lesions identified. IMPRESSION: Markedly enlarged uterus, likely due to fibroids. Mild to moderate right hydroureteronephrosis, due to mass effect on the distal right ureter by the markedly enlarged uterus. No ureteral calculi identified. Electronically Signed   By: Danae Orleans M.D.   On: 05/15/2023 16:20    I independently reviewed the above imaging studies.  Impression/Recommendation Right flank pain with right hydronephrosis likely due to markedly enlarged uterus and mass effect on distal ureter.  Patient also has azotemia with today creatinine up to 1.5.  We discussed the pros and cons of ureteral stent placement which given her bump in creatinine I think is probably the most conservative approach.  Nature procedure discussed in detail today including potential adverse events and complications as well as potential for significant postprocedural stent pain. Following our discussion patient elects to proceed.   Will try to schedule later today or tomorrow.  Joline Maxcy 05/16/2023, 7:47 AM

## 2023-05-16 NOTE — H&P (Signed)
PCP:   Pcp, No   Chief Complaint:  Right flank pain.   HPI:  This is a 32 year old female with no medical history.  She presents with complaints of right flank pain that has been present since Friday.  Her pain occasionally radiates to the bilateral hips.  On Wednesday the patient went to urgent care.  She was told she has constipation.  Medications taken for constipation did not impact discomfort.  Today she developed nausea and vomiting and her pain became severe.  She came to [facility], ER.   In the ER patient hemodynamically stable.  CT abdomen pelvis shows markedly enlarged uterus.  Mild to moderate hydronephrosis due to mass effect on right ureter from enlarged uterus.  Patient is received 4 mg of morphine. 0.5 and 1 mg IV Dilaudid.  Pain not fully controlled.  Hospital admission requested for pain control.  OB/GYN and urologist contacted by EDP.  Review of Systems:  Per HPI  Past Medical History: History reviewed. No pertinent past medical history. History reviewed. No pertinent surgical history.  Medications: Prior to Admission medications   Medication Sig Start Date End Date Taking? Authorizing Provider  Aspirin-Acetaminophen-Caffeine (GOODY HEADACHE PO) Take 1 Package by mouth daily as needed (headache, pain).   Yes [provider]  ibuprofen (ADVIL) 800 MG tablet Take 1 tablet (800 mg total) by mouth every 8 (eight) hours as needed (pain). 06/30/22  Yes Zenia Resides, MD  Norethin Ace-Eth Estrad-FE (JUNEL FE 24 PO) Take 1 tablet by mouth daily.   Yes [provider]    Allergies:  No Known Allergies  Social History:  reports that she has never smoked. She has never used smokeless tobacco. She reports that she does not drink alcohol and does not use drugs.  Family History: History reviewed. No pertinent family history.  Physical Exam: Vitals:   05/16/23 0000 05/16/23 0015 05/16/23 0030 05/16/23 0045  BP: 115/79 122/86 104/61 114/76  Pulse: 85 70  74 67  Resp: 13 14 14 13   Temp:      TempSrc:      SpO2: 97% 90% 96% 96%  Weight:      Height:        General:  A&O x3, well developed and nourished, today in no acute distress Eyes: Pink conjunctiva, no scleral icterus ENT: Moist oral mucosa, neck supple, no thyromegaly Lungs: CTA B/L, no wheeze, no crackles, no use of accessory muscles Cardiovascular: RRR, no regurgitation, no gallops, no murmurs. No carotid bruits, no JVD Abdomen: soft, positive BS, positive fullness in suprapubic region, positive TTP greatest in RLQ, Not an acute abdomen GU: not examined Neuro: CN II - XII grossly intact, sensation intact Musculoskeletal: strength 5/5 all extremities, no clubbing, cyanosis or edema Skin: no rash, no subcutaneous crepitation, no decubitus Psych: appropriate patient   Labs on Admission:  Recent Labs    05/15/23 1324  NA 138  K 3.8  CL 104  CO2 23  GLUCOSE 106*  BUN 11  CREATININE 1.30*  CALCIUM 9.7   Recent Labs    05/15/23 1324  AST 21  ALT 27  ALKPHOS 58  BILITOT 0.6  PROT 7.6  ALBUMIN 3.9   Recent Labs    05/15/23 1324  LIPASE 32   Recent Labs    05/15/23 1324  WBC 5.6  HGB 14.6  HCT 43.6  MCV 87.2  PLT 240     Radiological Exams on Admission: US Pelvis Complete  Result Date: 05/15/2023 CLINICAL DATA:  Right flank pain, enlarged uterus on CT EXAM: TRANSABDOMINAL ULTRASOUND OF PELVIS DOPPLER ULTRASOUND OF OVARIES TECHNIQUE: Transabdominal ultrasound examination of the pelvis was performed including evaluation of the uterus, ovaries, adnexal regions, and pelvic cul-de-sac. Color and duplex Doppler ultrasound was utilized to evaluate blood flow to the ovaries. COMPARISON:  05/15/2023 FINDINGS: Uterus Measurements: 18.3 x 10.9 x 15.5 cm. The uterus is diffusely heterogeneous. No discrete fibroids are visualized on transabdominal imaging. Patient could not tolerate endovaginal exam due to discomfort. Endometrium The endometrial stripe is not well  visualized. Right ovary Measurements: 5.1 x 2.3 x 2.3 cm = volume: 14.2 mL. Normal appearance/no adnexal mass. Left ovary Measurements: 3.5 x 1.4 x 2.3 cm = volume: 5.7 mL. Normal appearance/no adnexal mass. Pulsed Doppler evaluation demonstrates normal low-resistance arterial and venous waveforms in both ovaries. Other: No free fluid. IMPRESSION: 1. Markedly enlarged uterus, which demonstrates diffusely heterogeneous echotexture but no well-defined masses. The endometrial stripe is not well visualized. Given the lack of visible fibroids and the nonvisualization of the endometrium, further evaluation with nonemergent pelvic MRI as well as gynecologic consultation may be useful to exclude underlying endometrial or uterine neoplasm. 2. Unremarkable appearance of the ovaries.  No evidence of torsion. Electronically Signed   By: Sharlet Salina M.D.   On: 05/15/2023 18:48   [place] Art/Ven Flow Abd Pelv Doppler  Result Date: 05/15/2023 CLINICAL DATA:  Right flank pain, enlarged uterus on CT EXAM: TRANSABDOMINAL ULTRASOUND OF PELVIS DOPPLER ULTRASOUND OF OVARIES TECHNIQUE: Transabdominal ultrasound examination of the pelvis was performed including evaluation of the uterus, ovaries, adnexal regions, and pelvic cul-de-sac. Color and duplex Doppler ultrasound was utilized to evaluate blood flow to the ovaries. COMPARISON:  05/15/2023 FINDINGS: Uterus Measurements: 18.3 x 10.9 x 15.5 cm. The uterus is diffusely heterogeneous. No discrete fibroids are visualized on transabdominal imaging. Patient could not tolerate endovaginal exam due to discomfort. Endometrium The endometrial stripe is not well visualized. Right ovary Measurements: 5.1 x 2.3 x 2.3 cm = volume: 14.2 mL. Normal appearance/no adnexal mass. Left ovary Measurements: 3.5 x 1.4 x 2.3 cm = volume: 5.7 mL. Normal appearance/no adnexal mass. Pulsed Doppler evaluation demonstrates normal low-resistance arterial and venous waveforms in both ovaries. Other: No free  fluid. IMPRESSION: 1. Markedly enlarged uterus, which demonstrates diffusely heterogeneous echotexture but no well-defined masses. The endometrial stripe is not well visualized. Given the lack of visible fibroids and the nonvisualization of the endometrium, further evaluation with nonemergent pelvic MRI as well as gynecologic consultation may be useful to exclude underlying endometrial or uterine neoplasm. 2. Unremarkable appearance of the ovaries.  No evidence of torsion. Electronically Signed   By: Sharlet Salina M.D.   On: 05/15/2023 18:48   CT Renal Stone Study  Result Date: 05/15/2023 CLINICAL DATA:  Right-sided abdominal and flank pain for several days. Vomiting. EXAM: CT ABDOMEN AND PELVIS WITHOUT CONTRAST TECHNIQUE: Multidetector CT imaging of the abdomen and pelvis was performed following the standard protocol without IV contrast. RADIATION DOSE REDUCTION: This exam was performed according to the departmental dose-optimization program which includes automated exposure control, adjustment of the mA and/or kV according to patient size and/or use of iterative reconstruction technique. COMPARISON:  None Available. FINDINGS: Lower chest: No acute findings. Hepatobiliary: No mass visualized on this unenhanced exam. Gallbladder is unremarkable. No evidence of biliary ductal dilatation. Pancreas: No mass or inflammatory process visualized on this unenhanced exam. Spleen:  Within normal limits in size. Adrenals/Urinary tract: Mild to moderate right hydroureteronephrosis is seen no ureteral calculi identified.  This appears to be due to mass effect on the distal right ureter by markedly enlarged uterus. Stomach/Bowel: No evidence of obstruction, inflammatory process, or abnormal fluid collections. Normal appendix visualized. Vascular/Lymphatic: No pathologically enlarged lymph nodes identified. No evidence of abdominal aortic aneurysm. Reproductive: Markedly enlarged uterus, likely due to fibroids. Adnexal  regions are unremarkable. Tiny amount of free fluid noted in pelvis. Other:  None. Musculoskeletal:  No suspicious bone lesions identified. IMPRESSION: Markedly enlarged uterus, likely due to fibroids. Mild to moderate right hydroureteronephrosis, due to mass effect on the distal right ureter by the markedly enlarged uterus. No ureteral calculi identified. Electronically Signed   By: Danae Orleans M.D.   On: 05/15/2023 16:20    Assessment/Plan Present on Admission:  Mild to moderate right-sided hydronephrosis -Secondary to enlarged uterus.  Urologist Dr. Margo Aye contacted by EDP. Dr Margo Aye reviewed patient's CT and didn't feel she needed a ureteral stent immediately.  Formal consult in a.m.    Inadequate pain control -Scheduled Tylenol and Roxicodone. -PRN IV Dilaudid -Dose of Toradol given.   Enlarged uterus -OB/GYN Dr Dr Threasa Alpha contacted by EDP.   -Management Per OB/GYN.   Ordean Fouts 05/16/2023, 1:30 AM

## 2023-05-16 NOTE — Transfer of Care (Signed)
Immediate Anesthesia Transfer of Care Note  Patient: Lisa Herring  Procedure(s) Performed: CYSTOSCOPY WITH RETROGRADE PYELOGRAM/URETERAL STENT PLACEMENT (Right)  Patient Location: PACU  Anesthesia Type:General  Level of Consciousness: awake and alert   Airway & Oxygen Therapy: Patient Spontanous Breathing  Post-op Assessment: Report given to RN and Post -op Vital signs reviewed and stable  Post vital signs: Reviewed and stable  Last Vitals:  Vitals Value Taken Time  BP 101/65 05/16/23 1345  Temp 36.7 C 05/16/23 1341  Pulse 80 05/16/23 1351  Resp 13 05/16/23 1351  SpO2 99 % 05/16/23 1351  Vitals shown include unfiled device data.  Last Pain:  Vitals:   05/16/23 1341  TempSrc:   PainSc: Asleep         Complications: No notable events documented.

## 2023-05-17 ENCOUNTER — Encounter (HOSPITAL_COMMUNITY): Payer: Self-pay | Admitting: Urology

## 2023-05-17 ENCOUNTER — Observation Stay (HOSPITAL_COMMUNITY): Payer: 59

## 2023-05-17 DIAGNOSIS — Z5321 Procedure and treatment not carried out due to patient leaving prior to being seen by health care provider: Secondary | ICD-10-CM | POA: Diagnosis not present

## 2023-05-17 DIAGNOSIS — D259 Leiomyoma of uterus, unspecified: Secondary | ICD-10-CM | POA: Diagnosis present

## 2023-05-17 DIAGNOSIS — Z793 Long term (current) use of hormonal contraceptives: Secondary | ICD-10-CM | POA: Diagnosis not present

## 2023-05-17 DIAGNOSIS — Z6830 Body mass index (BMI) 30.0-30.9, adult: Secondary | ICD-10-CM | POA: Diagnosis not present

## 2023-05-17 DIAGNOSIS — R109 Unspecified abdominal pain: Secondary | ICD-10-CM | POA: Diagnosis present

## 2023-05-17 DIAGNOSIS — N131 Hydronephrosis with ureteral stricture, not elsewhere classified: Secondary | ICD-10-CM | POA: Diagnosis present

## 2023-05-17 DIAGNOSIS — N3289 Other specified disorders of bladder: Secondary | ICD-10-CM | POA: Diagnosis present

## 2023-05-17 DIAGNOSIS — N939 Abnormal uterine and vaginal bleeding, unspecified: Secondary | ICD-10-CM | POA: Diagnosis not present

## 2023-05-17 DIAGNOSIS — M7989 Other specified soft tissue disorders: Secondary | ICD-10-CM | POA: Diagnosis not present

## 2023-05-17 DIAGNOSIS — Z7982 Long term (current) use of aspirin: Secondary | ICD-10-CM | POA: Diagnosis not present

## 2023-05-17 DIAGNOSIS — K5900 Constipation, unspecified: Secondary | ICD-10-CM | POA: Diagnosis present

## 2023-05-17 DIAGNOSIS — R42 Dizziness and giddiness: Secondary | ICD-10-CM | POA: Diagnosis not present

## 2023-05-17 DIAGNOSIS — R319 Hematuria, unspecified: Secondary | ICD-10-CM | POA: Diagnosis present

## 2023-05-17 DIAGNOSIS — E669 Obesity, unspecified: Secondary | ICD-10-CM | POA: Diagnosis present

## 2023-05-17 DIAGNOSIS — R52 Pain, unspecified: Secondary | ICD-10-CM | POA: Diagnosis not present

## 2023-05-17 DIAGNOSIS — N179 Acute kidney failure, unspecified: Secondary | ICD-10-CM | POA: Diagnosis present

## 2023-05-17 LAB — CBC
HCT: 38.9 % (ref 36.0–46.0)
Hemoglobin: 13 g/dL (ref 12.0–15.0)
MCH: 29.7 pg (ref 26.0–34.0)
MCHC: 33.4 g/dL (ref 30.0–36.0)
MCV: 88.8 fL (ref 80.0–100.0)
Platelets: 200 10*3/uL (ref 150–400)
RBC: 4.38 MIL/uL (ref 3.87–5.11)
RDW: 13 % (ref 11.5–15.5)
WBC: 9 10*3/uL (ref 4.0–10.5)
nRBC: 0 % (ref 0.0–0.2)

## 2023-05-17 LAB — BASIC METABOLIC PANEL
Anion gap: 5 (ref 5–15)
BUN: 10 mg/dL (ref 6–20)
CO2: 25 mmol/L (ref 22–32)
Calcium: 8.7 mg/dL — ABNORMAL LOW (ref 8.9–10.3)
Chloride: 107 mmol/L (ref 98–111)
Creatinine, Ser: 1.43 mg/dL — ABNORMAL HIGH (ref 0.44–1.00)
GFR, Estimated: 50 mL/min — ABNORMAL LOW (ref >60)
Glucose, Bld: 99 mg/dL (ref 70–99)
Potassium: 4.1 mmol/L (ref 3.5–5.1)
Sodium: 137 mmol/L (ref 135–145)

## 2023-05-17 MED ORDER — GADOBUTROL 1 MMOL/ML IV SOLN
9.0000 mL | Freq: Once | INTRAVENOUS | Status: AC | PRN
  Administered 2023-05-17: 9 mL via INTRAVENOUS

## 2023-05-17 MED ORDER — SIMETHICONE 80 MG PO CHEW: 80.0000 mg | CHEWABLE_TABLET | Freq: Four times a day (QID) | ORAL | Status: DC | PRN

## 2023-05-17 NOTE — Progress Notes (Signed)
Dr Joice Lofts at [facility] is patient's preferred GYN provider

## 2023-05-17 NOTE — Telephone Encounter (Signed)
 Testing is positive for bacterial vaginosis.  Treat accordingly with metronidazole .  Follow-up with PCP to ensure the symptoms are improved.  1. Bacterial vaginosis  metroNIDAZOLE  (FLAGYL ) 500 mg tablet     Lisa Herring  Mon 05/17/2023 5:31 AM

## 2023-05-17 NOTE — Progress Notes (Signed)
PROGRESS NOTE    Lisa Herring  ZOX:[id number] DOB: 1991/04/13 DOA: 05/15/2023 PCP: Pcp, No   Brief Narrative:  32 year old female with no past medical history presented with right flank pain.  Workup revealed markedly enlarged uterus and mild to moderate hydronephrosis due to mass effect on right ureter from enlarged uterus.  She underwent cystoscopy and right ureteral stent placement by urology on 05/16/2023.  Assessment & Plan:   Mild to moderate right sided hydronephrosis secondary to enlarged uterus -underwent cystoscopy and right ureteral stent placement by urology on 05/16/2023. -Currently still has Foley catheter.  Follow further urology recommendations.  Possible acute kidney injury -Possibly from above.  Creatinine slightly improving to 1.43 today.  Getting IV fluids.  Repeat a.m. labs  Enlarged uterus Abdominal pain -Still having intermittent abdominal pain.  I have communicated with Dr. Gaye Pollack who will see the patient in consultation today.  Follow recommendations.  Obesity -Outpatient follow-up  DVT prophylaxis: Lovenox Code Status: Full Family Communication: Mother at bedside Disposition Plan: Status is: Observation The patient will require care spanning > 2 midnights and should be moved to inpatient because: Of severity of illness.  Still having significant pain.  Consultants: Labs/GYN  Procedures: As above  Antimicrobials: None   Subjective: Patient seen and examined at bedside.  Feels little better but still complains of intermittent lower abdominal pain.  No fever or vomiting reported.  Objective: Vitals:   05/16/23 1637 05/16/23 2053 05/17/23 0504 05/17/23 0751  BP: 120/79 130/70 118/76 124/80  Pulse: 72 81 66 67  Resp: 17 18 18 17   Temp: 98.8 F (37.1 C) 98.8 F (37.1 C) 98.3 F (36.8 C) 98.3 F (36.8 C)  TempSrc: Oral Oral Oral Oral  SpO2: 98% 97% 99% 96%  Weight:      Height:        Intake/Output Summary (Last 24 hours) at  05/17/2023 1055 Last data filed at 05/17/2023 0951 Gross per 24 hour  Intake 834.02 ml  Output 4625 ml  Net -3790.98 ml   Filed Weights   05/15/23 1315 05/16/23 1058  Weight: 86.2 kg 86.2 kg    Examination:  General exam: Appears calm and comfortable.  On room air.  Foley catheter present. Respiratory system: Bilateral decreased breath sounds at bases Cardiovascular system: S1 & S2 heard, Rate controlled Gastrointestinal system: Abdomen is obese, nondistended, soft and mildly tender in the lower quadrant, normal bowel sounds heard. Extremities: No cyanosis, clubbing, edema    Data Reviewed: I have personally reviewed following labs and imaging studies  CBC: Recent Labs  Lab 05/15/23 1324 05/16/23 0226 05/17/23 0842  WBC 5.6 6.4 9.0  NEUTROABS  --  3.2  --   HGB 14.6 12.4 13.0  HCT 43.6 36.9 38.9  MCV 87.2 88.1 88.8  PLT 240 200 200   Basic Metabolic Panel: Recent Labs  Lab 05/15/23 1324 05/16/23 0226 05/17/23 0842  NA 138 137 137  K 3.8 3.7 4.1  CL 104 105 107  CO2 23 24 25   GLUCOSE 106* 120* 99  BUN 11 16 10   CREATININE 1.30* 1.53* 1.43*  CALCIUM 9.7 8.8* 8.7*   GFR: Estimated Creatinine Clearance: 62.5 mL/min (A) (by C-G formula based on SCr of 1.43 mg/dL (H)). Liver Function Tests: Recent Labs  Lab 05/15/23 1324  AST 21  ALT 27  ALKPHOS 58  BILITOT 0.6  PROT 7.6  ALBUMIN 3.9   Recent Labs  Lab 05/15/23 1324  LIPASE 32   No results for input(s): "AMMONIA" in the  last 168 hours. Coagulation Profile: No results for input(s): "INR", "PROTIME" in the last 168 hours. Cardiac Enzymes: No results for input(s): "CKTOTAL", "CKMB", "CKMBINDEX", "TROPONINI" in the last 168 hours. BNP (last 3 results) No results for input(s): "PROBNP" in the last 8760 hours. HbA1C: No results for input(s): "HGBA1C" in the last 72 hours. CBG: No results for input(s): "GLUCAP" in the last 168 hours. Lipid Profile: No results for input(s): "CHOL", "HDL", "LDLCALC",  "TRIG", "CHOLHDL", "LDLDIRECT" in the last 72 hours. Thyroid Function Tests: No results for input(s): "TSH", "T4TOTAL", "FREET4", "T3FREE", "THYROIDAB" in the last 72 hours. Anemia Panel: No results for input(s): "VITAMINB12", "FOLATE", "FERRITIN", "TIBC", "IRON", "RETICCTPCT" in the last 72 hours. Sepsis Labs: No results for input(s): "PROCALCITON", "LATICACIDVEN" in the last 168 hours.  No results found for this or any previous visit (from the past 240 hour(s)).       Radiology Studies: DG C-Arm 1-60 Min  Result Date: 05/16/2023 CLINICAL DATA:  Fluoro guidance provided EXAM: DG C-ARM 1-60 MIN FINDINGS: Dose: Cumulative Air Kerma 41.3mG y Fluoro time 121s IMPRESSION: C-arm fluoro guidance provided. Electronically Signed   By: Layla Maw M.D.   On: 05/16/2023 16:45   US Pelvis Complete  Result Date: 05/15/2023 CLINICAL DATA:  Right flank pain, enlarged uterus on CT EXAM: TRANSABDOMINAL ULTRASOUND OF PELVIS DOPPLER ULTRASOUND OF OVARIES TECHNIQUE: Transabdominal ultrasound examination of the pelvis was performed including evaluation of the uterus, ovaries, adnexal regions, and pelvic cul-de-sac. Color and duplex Doppler ultrasound was utilized to evaluate blood flow to the ovaries. COMPARISON:  05/15/2023 FINDINGS: Uterus Measurements: 18.3 x 10.9 x 15.5 cm. The uterus is diffusely heterogeneous. No discrete fibroids are visualized on transabdominal imaging. Patient could not tolerate endovaginal exam due to discomfort. Endometrium The endometrial stripe is not well visualized. Right ovary Measurements: 5.1 x 2.3 x 2.3 cm = volume: 14.2 mL. Normal appearance/no adnexal mass. Left ovary Measurements: 3.5 x 1.4 x 2.3 cm = volume: 5.7 mL. Normal appearance/no adnexal mass. Pulsed Doppler evaluation demonstrates normal low-resistance arterial and venous waveforms in both ovaries. Other: No free fluid. IMPRESSION: 1. Markedly enlarged uterus, which demonstrates diffusely heterogeneous  echotexture but no well-defined masses. The endometrial stripe is not well visualized. Given the lack of visible fibroids and the nonvisualization of the endometrium, further evaluation with nonemergent pelvic MRI as well as gynecologic consultation may be useful to exclude underlying endometrial or uterine neoplasm. 2. Unremarkable appearance of the ovaries.  No evidence of torsion. Electronically Signed   By: Sharlet Salina M.D.   On: 05/15/2023 18:48   [place] Art/Ven Flow Abd Pelv Doppler  Result Date: 05/15/2023 CLINICAL DATA:  Right flank pain, enlarged uterus on CT EXAM: TRANSABDOMINAL ULTRASOUND OF PELVIS DOPPLER ULTRASOUND OF OVARIES TECHNIQUE: Transabdominal ultrasound examination of the pelvis was performed including evaluation of the uterus, ovaries, adnexal regions, and pelvic cul-de-sac. Color and duplex Doppler ultrasound was utilized to evaluate blood flow to the ovaries. COMPARISON:  05/15/2023 FINDINGS: Uterus Measurements: 18.3 x 10.9 x 15.5 cm. The uterus is diffusely heterogeneous. No discrete fibroids are visualized on transabdominal imaging. Patient could not tolerate endovaginal exam due to discomfort. Endometrium The endometrial stripe is not well visualized. Right ovary Measurements: 5.1 x 2.3 x 2.3 cm = volume: 14.2 mL. Normal appearance/no adnexal mass. Left ovary Measurements: 3.5 x 1.4 x 2.3 cm = volume: 5.7 mL. Normal appearance/no adnexal mass. Pulsed Doppler evaluation demonstrates normal low-resistance arterial and venous waveforms in both ovaries. Other: No free fluid. IMPRESSION: 1. Markedly enlarged uterus,  which demonstrates diffusely heterogeneous echotexture but no well-defined masses. The endometrial stripe is not well visualized. Given the lack of visible fibroids and the nonvisualization of the endometrium, further evaluation with nonemergent pelvic MRI as well as gynecologic consultation may be useful to exclude underlying endometrial or uterine neoplasm. 2. Unremarkable  appearance of the ovaries.  No evidence of torsion. Electronically Signed   By: Sharlet Salina M.D.   On: 05/15/2023 18:48   CT Renal Stone Study  Result Date: 05/15/2023 CLINICAL DATA:  Right-sided abdominal and flank pain for several days. Vomiting. EXAM: CT ABDOMEN AND PELVIS WITHOUT CONTRAST TECHNIQUE: Multidetector CT imaging of the abdomen and pelvis was performed following the standard protocol without IV contrast. RADIATION DOSE REDUCTION: This exam was performed according to the departmental dose-optimization program which includes automated exposure control, adjustment of the mA and/or kV according to patient size and/or use of iterative reconstruction technique. COMPARISON:  None Available. FINDINGS: Lower chest: No acute findings. Hepatobiliary: No mass visualized on this unenhanced exam. Gallbladder is unremarkable. No evidence of biliary ductal dilatation. Pancreas: No mass or inflammatory process visualized on this unenhanced exam. Spleen:  Within normal limits in size. Adrenals/Urinary tract: Mild to moderate right hydroureteronephrosis is seen no ureteral calculi identified. This appears to be due to mass effect on the distal right ureter by markedly enlarged uterus. Stomach/Bowel: No evidence of obstruction, inflammatory process, or abnormal fluid collections. Normal appendix visualized. Vascular/Lymphatic: No pathologically enlarged lymph nodes identified. No evidence of abdominal aortic aneurysm. Reproductive: Markedly enlarged uterus, likely due to fibroids. Adnexal regions are unremarkable. Tiny amount of free fluid noted in pelvis. Other:  None. Musculoskeletal:  No suspicious bone lesions identified. IMPRESSION: Markedly enlarged uterus, likely due to fibroids. Mild to moderate right hydroureteronephrosis, due to mass effect on the distal right ureter by the markedly enlarged uterus. No ureteral calculi identified. Electronically Signed   By: Danae Orleans M.D.   On: 05/15/2023 16:20         Scheduled Meds:  acetaminophen  1,000 mg Oral Q6H   Chlorhexidine Gluconate Cloth  6 each Topical Q0600   enoxaparin (LOVENOX) injection  40 mg Subcutaneous Daily   polyethylene glycol  17 g Oral Daily   Continuous Infusions:  sodium chloride 100 mL/hr at 05/17/23 0951   promethazine (PHENERGAN) injection (IM or IVPB) Stopped (05/15/23 1946)          Glade Lloyd, MD [facility] 05/17/2023, 10:55 AM

## 2023-05-17 NOTE — Progress Notes (Addendum)
1 Day Post-Op Procedure(s) (LRB): CYSTOSCOPY WITH RETROGRADE PYELOGRAM/URETERAL STENT PLACEMENT (Right)  Subjective: Patient reports feeling better but still with the right sided flank pain, pain was 10/10 before but today is 7/10 intensity.  She desires to discuss further management of her fibroids.   Objective:    05/17/2023    7:51 AM 05/17/2023    5:04 AM 05/16/2023    8:53 PM  Vitals with BMI  Systolic 124 118 161  Diastolic 80 76 70  Pulse 67 66 81   I/O last 3 completed shifts: In: 48.6 [IV Piggyback:48.6] Out: 4625 [Urine:4625]   General: alert, cooperative, and no distress Resp: Breathing normally Vaginal Bleeding: none  With foley catheter draining urine, blood tinged. Abdomen: 20 week sized uterus, tender to touch on right flanl and right lower abdomen, no rebound, no guarding.   Results for orders placed or performed during the hospital encounter of 05/15/23 (from the past 24 hour(s))  CBC     Status: None   Collection Time: 05/17/23  8:42 AM  Result Value Ref Range   WBC 9.0 4.0 - 10.5 K/uL   RBC 4.38 3.87 - 5.11 MIL/uL   Hemoglobin 13.0 12.0 - 15.0 g/dL   HCT 09.6 04.5 - 40.9 %   MCV 88.8 80.0 - 100.0 fL   MCH 29.7 26.0 - 34.0 pg   MCHC 33.4 30.0 - 36.0 g/dL   RDW 81.1 91.4 - 78.2 %   Platelets 200 150 - 400 K/uL   nRBC 0.0 0.0 - 0.2 %  Basic metabolic panel     Status: Abnormal   Collection Time: 05/17/23  8:42 AM  Result Value Ref Range   Sodium 137 135 - 145 mmol/L   Potassium 4.1 3.5 - 5.1 mmol/L   Chloride 107 98 - 111 mmol/L   CO2 25 22 - 32 mmol/L   Glucose, Bld 99 70 - 99 mg/dL   BUN 10 6 - 20 mg/dL   Creatinine, Ser 9.56 (H) 0.44 - 1.00 mg/dL   Calcium 8.7 (L) 8.9 - 10.3 mg/dL   GFR, Estimated 50 (L) >60 mL/min   Anion gap 5 5 - 15    05/17/23:  Narrative & Impression  CLINICAL DATA:  Uterine fibroid   EXAM: MRI PELVIS WITHOUT AND WITH CONTRAST   TECHNIQUE: Multiplanar multisequence MR imaging of the pelvis was performed both before  and after administration of intravenous contrast.   CONTRAST:  9mL GADAVIST GADOBUTROL 1 MMOL/ML IV SOLN   COMPARISON:  None Available.   FINDINGS: Urinary Tract: Underdistended urinary bladder. Slight wall thickening. Foley catheter in place with some dependent air. Preserved expected course of the urethra.   Bowel: Visualized bowel in the pelvis is nondilated. Scattered colonic stool.   Vascular/Lymphatic: Normal caliber visualized abdominal aorta and iliac vessels. Few small inguinal lymph nodes identified, nonpathologic by size criteria. No specific pathologic pelvic nodes otherwise. Prominent periuterine vessels.   Reproductive:   Uterus: Measures uterus measures 19.0 x 11.0 x 13.8 cm. There is large central heterogeneous mass which is predominantly dark on precontrast T2, isointense on T1. The lesion has significant enhancement on the dynamic examination consistent with a large enhancing fibroid this has measure proximally 11.6 by 10.0 by 15.3 cm. There is separate smaller midbody anterior fibroid identified measuring 12 mm with similar signal and enhancement criteria. Large fibroid does distort the underlying endometrium, poorly seen today. The endometrium appears displaced anterior right and is grossly thin. Evaluation of the junctional zone is limited.  Right ovary: Small follicles are present. Ovary measures 20 x 18 by 34 mm. Normal enhancement.   Left ovary: Small left-sided follicles are seen. Left ovary measures 3.0 x 1.6 by 2.0 cm. Preserved enhancement.   Other: Trace free fluid in the pelvis.   Musculoskeletal:  Unremarkable.   IMPRESSION: Large heterogeneous uterine fibroid measuring up to 15.3 cm with distortion of the underlying endometrium, subendometrial component. There is significant enhancement of the fibroid. Smaller fibroid seen elsewhere as well anteriorly.   Preserved ovaries.  Trace free fluid in the pelvis.     Electronically Signed    By: Karen Kays M.D.   On: 05/17/2023 17:20    Assessment: 32 y/o G0 with a history of fibroids and s/p Procedure(s): CYSTOSCOPY WITH RETROGRADE PYELOGRAM/URETERAL STENT PLACEMENT (Right): stable Which was done for right flank pain and found to have right hydronephrosis secondary to uterus with fibroids,  Plan: Recent Abdomen CT and pelvic ultrasound showed enlarged 18 cm uterus, but no distinct fibroids,  - Will order MRI of pelvis for further assessment of uterus- results were obtained and is as above.  - Discussed with patient possible abdominal myomectomy and will plan for surgery within the next few weeks.  As per urology, Dr. Wilson Singer, the stent in right ureter can stay for up to 3 months. Plan will be to remove the stent after her fibroid surgery.   LOS: 0 days    Prescilla Sours, MD 05/17/2023, 5:10 PM

## 2023-05-17 NOTE — Progress Notes (Signed)
1 Day Post-Op Subjective: Pt was off the floor for stat MRI. Spoke with mother and sister  Objective: Vital signs in last 24 hours: Temp:  [97.8 F (36.6 C)-98.8 F (37.1 C)] 98.3 F (36.8 C) (11/18 0751) Pulse Rate:  [66-81] 67 (11/18 0751) Resp:  [16-18] 17 (11/18 0751) BP: (113-130)/(70-85) 124/80 (11/18 0751) SpO2:  [95 %-100 %] 96 % (11/18 0751)  Assessment/Plan: #right hydronephrosis Mild to moderate right-sided hydronephrosis 2/2 mass effect on the distal ureter from enlarged uterus. To the OR with Dr. Margo Aye the afternoon of 05/16/2023.  Right ureteral stent placed. Per her sister, patient began to have significant vaginal bleeding resulting in an urgent MRI ordered by the gynecologist. Also reports hematuria which is developed over the last 24 hours.  Would hold Lovenox unless contraindicated. Will see her tomorrow  Intake/Output from previous day: 11/17 0701 - 11/18 0700 In: -  Out: 4625 [Urine:4625]  Intake/Output this shift: Total I/O In: 834 [I.V.:834] Out: -   Physical Exam:    Lab Results: Recent Labs    05/15/23 1324 05/16/23 0226 05/17/23 0842  HGB 14.6 12.4 13.0  HCT 43.6 36.9 38.9   BMET Recent Labs    05/16/23 0226 05/17/23 0842  NA 137 137  K 3.7 4.1  CL 105 107  CO2 24 25  GLUCOSE 120* 99  BUN 16 10  CREATININE 1.53* 1.43*  CALCIUM 8.8* 8.7*     Studies/Results: DG C-Arm 1-60 Min  Result Date: 05/16/2023 CLINICAL DATA:  Fluoro guidance provided EXAM: DG C-ARM 1-60 MIN FINDINGS: Dose: Cumulative Air Kerma 41.3mG y Fluoro time 121s IMPRESSION: C-arm fluoro guidance provided. Electronically Signed   By: Layla Maw M.D.   On: 05/16/2023 16:45   US Pelvis Complete  Result Date: 05/15/2023 CLINICAL DATA:  Right flank pain, enlarged uterus on CT EXAM: TRANSABDOMINAL ULTRASOUND OF PELVIS DOPPLER ULTRASOUND OF OVARIES TECHNIQUE: Transabdominal ultrasound examination of the pelvis was performed including evaluation of the  uterus, ovaries, adnexal regions, and pelvic cul-de-sac. Color and duplex Doppler ultrasound was utilized to evaluate blood flow to the ovaries. COMPARISON:  05/15/2023 FINDINGS: Uterus Measurements: 18.3 x 10.9 x 15.5 cm. The uterus is diffusely heterogeneous. No discrete fibroids are visualized on transabdominal imaging. Patient could not tolerate endovaginal exam due to discomfort. Endometrium The endometrial stripe is not well visualized. Right ovary Measurements: 5.1 x 2.3 x 2.3 cm = volume: 14.2 mL. Normal appearance/no adnexal mass. Left ovary Measurements: 3.5 x 1.4 x 2.3 cm = volume: 5.7 mL. Normal appearance/no adnexal mass. Pulsed Doppler evaluation demonstrates normal low-resistance arterial and venous waveforms in both ovaries. Other: No free fluid. IMPRESSION: 1. Markedly enlarged uterus, which demonstrates diffusely heterogeneous echotexture but no well-defined masses. The endometrial stripe is not well visualized. Given the lack of visible fibroids and the nonvisualization of the endometrium, further evaluation with nonemergent pelvic MRI as well as gynecologic consultation may be useful to exclude underlying endometrial or uterine neoplasm. 2. Unremarkable appearance of the ovaries.  No evidence of torsion. Electronically Signed   By: Sharlet Salina M.D.   On: 05/15/2023 18:48   [place] Art/Ven Flow Abd Pelv Doppler  Result Date: 05/15/2023 CLINICAL DATA:  Right flank pain, enlarged uterus on CT EXAM: TRANSABDOMINAL ULTRASOUND OF PELVIS DOPPLER ULTRASOUND OF OVARIES TECHNIQUE: Transabdominal ultrasound examination of the pelvis was performed including evaluation of the uterus, ovaries, adnexal regions, and pelvic cul-de-sac. Color and duplex Doppler ultrasound was utilized to evaluate blood flow to the ovaries. COMPARISON:  05/15/2023 FINDINGS: Uterus  Measurements: 18.3 x 10.9 x 15.5 cm. The uterus is diffusely heterogeneous. No discrete fibroids are visualized on transabdominal imaging. Patient  could not tolerate endovaginal exam due to discomfort. Endometrium The endometrial stripe is not well visualized. Right ovary Measurements: 5.1 x 2.3 x 2.3 cm = volume: 14.2 mL. Normal appearance/no adnexal mass. Left ovary Measurements: 3.5 x 1.4 x 2.3 cm = volume: 5.7 mL. Normal appearance/no adnexal mass. Pulsed Doppler evaluation demonstrates normal low-resistance arterial and venous waveforms in both ovaries. Other: No free fluid. IMPRESSION: 1. Markedly enlarged uterus, which demonstrates diffusely heterogeneous echotexture but no well-defined masses. The endometrial stripe is not well visualized. Given the lack of visible fibroids and the nonvisualization of the endometrium, further evaluation with nonemergent pelvic MRI as well as gynecologic consultation may be useful to exclude underlying endometrial or uterine neoplasm. 2. Unremarkable appearance of the ovaries.  No evidence of torsion. Electronically Signed   By: Sharlet Salina M.D.   On: 05/15/2023 18:48   CT Renal Stone Study  Result Date: 05/15/2023 CLINICAL DATA:  Right-sided abdominal and flank pain for several days. Vomiting. EXAM: CT ABDOMEN AND PELVIS WITHOUT CONTRAST TECHNIQUE: Multidetector CT imaging of the abdomen and pelvis was performed following the standard protocol without IV contrast. RADIATION DOSE REDUCTION: This exam was performed according to the departmental dose-optimization program which includes automated exposure control, adjustment of the mA and/or kV according to patient size and/or use of iterative reconstruction technique. COMPARISON:  None Available. FINDINGS: Lower chest: No acute findings. Hepatobiliary: No mass visualized on this unenhanced exam. Gallbladder is unremarkable. No evidence of biliary ductal dilatation. Pancreas: No mass or inflammatory process visualized on this unenhanced exam. Spleen:  Within normal limits in size. Adrenals/Urinary tract: Mild to moderate right hydroureteronephrosis is seen no  ureteral calculi identified. This appears to be due to mass effect on the distal right ureter by markedly enlarged uterus. Stomach/Bowel: No evidence of obstruction, inflammatory process, or abnormal fluid collections. Normal appendix visualized. Vascular/Lymphatic: No pathologically enlarged lymph nodes identified. No evidence of abdominal aortic aneurysm. Reproductive: Markedly enlarged uterus, likely due to fibroids. Adnexal regions are unremarkable. Tiny amount of free fluid noted in pelvis. Other:  None. Musculoskeletal:  No suspicious bone lesions identified. IMPRESSION: Markedly enlarged uterus, likely due to fibroids. Mild to moderate right hydroureteronephrosis, due to mass effect on the distal right ureter by the markedly enlarged uterus. No ureteral calculi identified. Electronically Signed   By: Danae Orleans M.D.   On: 05/15/2023 16:20      LOS: 0 days   Elmon Kirschner, NP [facility] Pager: [phone]  05/17/2023, 2:09 PM

## 2023-05-18 DIAGNOSIS — R52 Pain, unspecified: Secondary | ICD-10-CM | POA: Diagnosis not present

## 2023-05-18 LAB — BASIC METABOLIC PANEL
Anion gap: 6 (ref 5–15)
BUN: 12 mg/dL (ref 6–20)
CO2: 24 mmol/L (ref 22–32)
Calcium: 8.7 mg/dL — ABNORMAL LOW (ref 8.9–10.3)
Chloride: 107 mmol/L (ref 98–111)
Creatinine, Ser: 1.15 mg/dL — ABNORMAL HIGH (ref 0.44–1.00)
GFR, Estimated: >60 mL/min (ref >60)
Glucose, Bld: 94 mg/dL (ref 70–99)
Potassium: 4.2 mmol/L (ref 3.5–5.1)
Sodium: 137 mmol/L (ref 135–145)

## 2023-05-18 LAB — CBC WITH DIFFERENTIAL/PLATELET
Abs Immature Granulocytes: 0.01 10*3/uL (ref 0.00–0.07)
Basophils Absolute: 0 10*3/uL (ref 0.0–0.1)
Basophils Relative: 0 %
Eosinophils Absolute: 0.1 10*3/uL (ref 0.0–0.5)
Eosinophils Relative: 1 %
HCT: 36.3 % (ref 36.0–46.0)
Hemoglobin: 12.4 g/dL (ref 12.0–15.0)
Immature Granulocytes: 0 %
Lymphocytes Relative: 42 %
Lymphs Abs: 2.4 10*3/uL (ref 0.7–4.0)
MCH: 30 pg (ref 26.0–34.0)
MCHC: 34.2 g/dL (ref 30.0–36.0)
MCV: 87.9 fL (ref 80.0–100.0)
Monocytes Absolute: 0.4 10*3/uL (ref 0.1–1.0)
Monocytes Relative: 8 %
Neutro Abs: 2.8 10*3/uL (ref 1.7–7.7)
Neutrophils Relative %: 49 %
Platelets: 199 10*3/uL (ref 150–400)
RBC: 4.13 MIL/uL (ref 3.87–5.11)
RDW: 13.2 % (ref 11.5–15.5)
WBC: 5.8 10*3/uL (ref 4.0–10.5)
nRBC: 0 % (ref 0.0–0.2)

## 2023-05-18 LAB — MAGNESIUM: Magnesium: 1.7 mg/dL (ref 1.7–2.4)

## 2023-05-18 MED ORDER — SENNOSIDES-DOCUSATE SODIUM 8.6-50 MG PO TABS: 2.0000 | ORAL_TABLET | Freq: Every evening | ORAL | 0 refills | Status: DC | PRN

## 2023-05-18 MED ORDER — BISACODYL 10 MG RE SUPP: 10.0000 mg | Freq: Every day | RECTAL | Status: DC | PRN

## 2023-05-18 MED ORDER — OXYCODONE HCL 5 MG PO TABS
5.0000 mg | ORAL_TABLET | ORAL | Status: DC | PRN
  Administered 2023-05-18: 5 mg via ORAL
  Filled 2023-05-18 (×2): qty 1

## 2023-05-18 MED ORDER — OXYCODONE HCL 5 MG PO TABS: 5.0000 mg | ORAL_TABLET | ORAL | 0 refills | Status: DC | PRN

## 2023-05-18 MED ORDER — BISACODYL 5 MG PO TBEC: 5.0000 mg | DELAYED_RELEASE_TABLET | Freq: Every day | ORAL | 0 refills | Status: DC | PRN

## 2023-05-18 MED ORDER — ACETAMINOPHEN 500 MG PO TABS: 1000.0000 mg | ORAL_TABLET | Freq: Four times a day (QID) | ORAL | 0 refills | Status: DC

## 2023-05-18 MED ORDER — POLYETHYLENE GLYCOL 3350 17 G PO PACK: 17.0000 g | PACK | Freq: Every day | ORAL | 0 refills | Status: DC | PRN

## 2023-05-18 NOTE — Progress Notes (Incomplete)
2 Days Post-Op Procedure(s) (LRB): CYSTOSCOPY WITH RETROGRADE PYELOGRAM/URETERAL STENT PLACEMENT (Right)  Subjective: Patient reports {sub:[id number]}.    Objective:    05/18/2023    8:04 AM 05/17/2023    8:09 PM 05/17/2023    7:51 AM  Vitals with BMI  Systolic 121 130 696  Diastolic 87 84 80  Pulse 72 74 67     {Physical Exam:[id number]}  Assessment: s/p Procedure(s): CYSTOSCOPY WITH RETROGRADE PYELOGRAM/URETERAL STENT PLACEMENT (Right): {assessment details:3041134}  Plan: {EXBM:[id number]}  LOS: 1 day    Prescilla Sours, MD 05/18/2023, 12:07 PM

## 2023-05-18 NOTE — Anesthesia Postprocedure Evaluation (Signed)
Anesthesia Post Note  Patient: Lisa Herring  Procedure(s) Performed: CYSTOSCOPY WITH RETROGRADE PYELOGRAM/URETERAL STENT PLACEMENT (Right)     Patient location during evaluation: PACU Anesthesia Type: General Level of consciousness: awake and alert Pain management: pain level controlled Vital Signs Assessment: post-procedure vital signs reviewed and stable Respiratory status: spontaneous breathing, nonlabored ventilation, respiratory function stable and patient connected to nasal cannula oxygen Cardiovascular status: blood pressure returned to baseline and stable Postop Assessment: no apparent nausea or vomiting Anesthetic complications: no   No notable events documented.  Last Vitals:  Vitals:   05/17/23 0751 05/17/23 2009  BP: 124/80 130/84  Pulse: 67 74  Resp: 17 20  Temp: 36.8 C 36.8 C  SpO2: 96% 97%    Last Pain:  Vitals:   05/17/23 2039  TempSrc:   PainSc: Asleep                 Amando Chaput S

## 2023-05-18 NOTE — Progress Notes (Signed)
2 Days Post-Op Subjective: Pt resting on rounds. Still having some flank pain but reports it unchanged in character from before her stent. Likely multifactorial between stent, uterine mass, and severe constipation  Objective: Vital signs in last 24 hours: Temp:  [98 F (36.7 C)-98.2 F (36.8 C)] 98 F (36.7 C) (11/19 0804) Pulse Rate:  [72-74] 72 (11/19 0804) Resp:  [18-20] 18 (11/19 0804) BP: (121-130)/(84-87) 121/87 (11/19 0804) SpO2:  [96 %-97 %] 96 % (11/19 0804)  Assessment/Plan: #right hydronephrosis Mild to moderate right-sided hydronephrosis 2/2 mass effect on the distal ureter from enlarged uterus. To the OR with Dr. Margo Aye the afternoon of 05/16/2023.  Right ureteral stent placed. Trend labs, renal function has nearly normalized. Good UOP New onset vaginal bleeding yesterday.  Stat CT and pelvic ultrasound showed a large 18 cm uterus with no distinct fibroids.  MRI pending Recommend holding Lovenox if this is only being provided for DVT prophylaxis. She reports not having bowel movement x 2 weeks and that she has been vomited up multiple oral laxatives.  May benefit from input from GI Spoke with OB/GYN last night about the case.  It is not clear when she will be able to go for her planned myomectomy but they will contact me once the time is set and I have offered to present to the OR for cysto stent removal before patient is taken to PACU. Pt requested I personally remove her foley catheter. Chaperone was declined.  Urology will sign off at this time.  Please feel free to call with questions or concerns.  Intake/Output from previous day: 11/18 0701 - 11/19 0700 In: 1643.2 [P.O.:340; I.V.:1303.2] Out: 1500 [Urine:1500]  Intake/Output this shift: No intake/output data recorded.  Physical Exam:   Physical Exam Constitutional:      General: She is not in acute distress.    Appearance: She is well-developed. She is not ill-appearing, toxic-appearing or diaphoretic.   HENT:     Head: Normocephalic and atraumatic.  Pulmonary:     Effort: Pulmonary effort is normal.  Abdominal:     General: Abdomen is protuberant. There is distension.  Skin:    General: Skin is warm and dry.  Neurological:     General: No focal deficit present.     Mental Status: She is alert and oriented to person, place, and time.  Psychiatric:        Mood and Affect: Mood normal.        Behavior: Behavior normal.     Lab Results: Recent Labs    05/16/23 0226 05/17/23 0842 05/18/23 0758  HGB 12.4 13.0 12.4  HCT 36.9 38.9 36.3   BMET Recent Labs    05/17/23 0842 05/18/23 0758  NA 137 137  K 4.1 4.2  CL 107 107  CO2 25 24  GLUCOSE 99 94  BUN 10 12  CREATININE 1.43* 1.15*  CALCIUM 8.7* 8.7*     Studies/Results: MR PELVIS W WO CONTRAST  Addendum Date: 05/17/2023   ADDENDUM REPORT: 05/17/2023 18:41 ADDENDUM: Additional details. The myometrium is significantly stretched by the posterior left fibroid. There is a very thin rind of preserved myometrium suggested along the left side of the uterus as thin as 5 mm for example series 5, image 19. There is more preserved myometrium albeit stretched by the fibroid an other locations. 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 The previous right-sided renal collecting system dilatation is no longer seen. By history the patient has  a ureteral stent. There is new mild left-sided renal collecting system dilatation. Electronically Signed   By: Karen Kays M.D.   On: 05/17/2023 18:41   Result Date: 05/17/2023 CLINICAL DATA:  Uterine fibroid EXAM: MRI PELVIS WITHOUT AND WITH CONTRAST TECHNIQUE: Multiplanar multisequence MR imaging of the pelvis was performed both before and after administration of intravenous contrast. CONTRAST:  9mL GADAVIST GADOBUTROL 1 MMOL/ML IV SOLN COMPARISON:  None Available. FINDINGS: Urinary Tract: Underdistended urinary bladder. Slight wall thickening. Foley catheter in place with  some dependent air. Preserved expected course of the urethra. Bowel: Visualized bowel in the pelvis is nondilated. Scattered colonic stool. Vascular/Lymphatic: Normal caliber visualized abdominal aorta and iliac vessels. Few small inguinal lymph nodes identified, nonpathologic by size criteria. No specific pathologic pelvic nodes otherwise. Prominent periuterine vessels. Reproductive: Uterus: Measures uterus measures 19.0 x 11.0 x 13.8 cm. There is large central heterogeneous mass which is predominantly dark on precontrast T2, isointense on T1. The lesion has significant enhancement on the dynamic examination consistent with a large enhancing fibroid this has measure proximally 11.6 by 10.0 by 15.3 cm. There is separate smaller midbody anterior fibroid identified measuring 12 mm with similar signal and enhancement criteria. Large fibroid does distort the underlying endometrium, poorly seen today. The endometrium appears displaced anterior right and is grossly thin. Evaluation of the junctional zone is limited. Right ovary: Small follicles are present. Ovary measures 20 x 18 by 34 mm. Normal enhancement. Left ovary: Small left-sided follicles are seen. Left ovary measures 3.0 x 1.6 by 2.0 cm. Preserved enhancement. Other: Trace free fluid in the pelvis. Musculoskeletal:  Unremarkable. IMPRESSION: Large heterogeneous uterine fibroid measuring up to 15.3 cm with distortion of the underlying endometrium, subendometrial component. There is significant enhancement of the fibroid. Smaller fibroid seen elsewhere as well anteriorly. Preserved ovaries.  Trace free fluid in the pelvis. Electronically Signed: By: Karen Kays M.D. On: 05/17/2023 17:20   DG C-Arm 1-60 Min  Result Date: 05/16/2023 CLINICAL DATA:  Fluoro guidance provided EXAM: DG C-ARM 1-60 MIN FINDINGS: Dose: Cumulative Air Kerma 41.3mG y Fluoro time 121s IMPRESSION: C-arm fluoro guidance provided. Electronically Signed   By: Layla Maw M.D.   On:  05/16/2023 16:45      LOS: 1 day   Elmon Kirschner, NP [facility] Pager: [phone]  05/18/2023, 12:12 PM

## 2023-05-18 NOTE — Discharge Summary (Signed)
Physician Discharge Summary  Lisa Herring GNF:[id number] DOB: 07-25-1990 DOA: 05/15/2023  PCP: Oneita Hurt, No  Admit date: 05/15/2023 Discharge date: 05/18/2023  Admitted From: Home Disposition: Home  Recommendations for Outpatient Follow-up:  Follow up with PCP in 1 week with repeat CBC/BMP Outpatient follow-up with urology and GYN Follow up in ED if symptoms worsen or new appear   Home Health: No Equipment/Devices: None  Discharge Condition: Stable CODE STATUS: Full Diet recommendation: Heart healthy  Brief/Interim Summary: 32 year old female with no past medical history presented with right flank pain. Workup revealed markedly enlarged uterus and mild to moderate hydronephrosis due to mass effect on right ureter from enlarged uterus. She underwent cystoscopy and right ureteral stent placement by urology on 05/16/2023.  Subsequently, her condition is improving.  Kidney function is improving with IV fluids.  MRI of pelvis showed large uterine fibroid: GYN recommended outpatient follow-up for surgical intervention.  Urology has cleared the patient for discharge and recommended outpatient follow-up.  GYN also has cleared the patient for discharge.  Discharge patient home today.  Discharge Diagnoses:   Mild to moderate right sided hydronephrosis secondary to enlarged uterus -underwent cystoscopy and right ureteral stent placement by urology on 05/16/2023. -Foley catheter removed today by urology. Urology recommended outpatient follow-up.    Possible acute kidney injury -Possibly from above.  Creatinine improving to 1.15 today.  Treated IV fluids.  DC IV fluids.  Outpatient follow-up.  Uterine fibroid Abdominal pain -MRI of pelvis showed large uterine fibroid: GYN recommended outpatient follow-up for surgical intervention.  GYN has cleared the patient for discharge   Obesity -Outpatient follow-up   Discharge Instructions   Allergies as of 05/18/2023   No Known Allergies       Medication List     TAKE these medications    acetaminophen 500 MG tablet Commonly known as: TYLENOL Take 2 tablets (1,000 mg total) by mouth every 6 (six) hours.   GOODY HEADACHE PO Take 1 Package by mouth daily as needed (headache, pain).   ibuprofen 800 MG tablet Commonly known as: ADVIL Take 1 tablet (800 mg total) by mouth every 8 (eight) hours as needed (pain).   JUNEL FE 24 PO Take 1 tablet by mouth daily.   polyethylene glycol 17 g packet Commonly known as: MIRALAX / GLYCOLAX Take 17 g by mouth daily as needed.   senna-docusate 8.6-50 MG tablet Commonly known as: Senokot-S Take 2 tablets by mouth at bedtime as needed for mild constipation.        Follow-up Information     PCP. Schedule an appointment as soon as possible for a visit in 1 week(s).          Joline Maxcy, MD. Schedule an appointment as soon as possible for a visit in 1 week(s).   Specialty: Urology Contact information: [address] [place] [address] [phone]         Hoover Browns, MD. Schedule an appointment as soon as possible for a visit in 1 week(s).   Specialty: Obstetrics and Gynecology Contact information: [address] [place] [address] [phone]                No Known Allergies  Consultations: Urology/GYN   Procedures/Studies: MR PELVIS W WO CONTRAST  Addendum Date: 05/17/2023   ADDENDUM REPORT: 05/17/2023 18:41 ADDENDUM: Additional details. The myometrium is significantly stretched by the posterior left fibroid. There is a very thin rind of preserved myometrium suggested along the left side  of the uterus as thin as 5 mm for example series 5, image 19. There is more preserved myometrium albeit stretched by the fibroid an other locations. 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 The previous right-sided renal collecting system dilatation is no longer seen. By history the patient has a  ureteral stent. There is new mild left-sided renal collecting system dilatation. Electronically Signed   By: Karen Kays M.D.   On: 05/17/2023 18:41   Result Date: 05/17/2023 CLINICAL DATA:  Uterine fibroid EXAM: MRI PELVIS WITHOUT AND WITH CONTRAST TECHNIQUE: Multiplanar multisequence MR imaging of the pelvis was performed both before and after administration of intravenous contrast. CONTRAST:  9mL GADAVIST GADOBUTROL 1 MMOL/ML IV SOLN COMPARISON:  None Available. FINDINGS: Urinary Tract: Underdistended urinary bladder. Slight wall thickening. Foley catheter in place with some dependent air. Preserved expected course of the urethra. Bowel: Visualized bowel in the pelvis is nondilated. Scattered colonic stool. Vascular/Lymphatic: Normal caliber visualized abdominal aorta and iliac vessels. Few small inguinal lymph nodes identified, nonpathologic by size criteria. No specific pathologic pelvic nodes otherwise. Prominent periuterine vessels. Reproductive: Uterus: Measures uterus measures 19.0 x 11.0 x 13.8 cm. There is large central heterogeneous mass which is predominantly dark on precontrast T2, isointense on T1. The lesion has significant enhancement on the dynamic examination consistent with a large enhancing fibroid this has measure proximally 11.6 by 10.0 by 15.3 cm. There is separate smaller midbody anterior fibroid identified measuring 12 mm with similar signal and enhancement criteria. Large fibroid does distort the underlying endometrium, poorly seen today. The endometrium appears displaced anterior right and is grossly thin. Evaluation of the junctional zone is limited. Right ovary: Small follicles are present. Ovary measures 20 x 18 by 34 mm. Normal enhancement. Left ovary: Small left-sided follicles are seen. Left ovary measures 3.0 x 1.6 by 2.0 cm. Preserved enhancement. Other: Trace free fluid in the pelvis. Musculoskeletal:  Unremarkable. IMPRESSION: Large heterogeneous uterine fibroid measuring up  to 15.3 cm with distortion of the underlying endometrium, subendometrial component. There is significant enhancement of the fibroid. Smaller fibroid seen elsewhere as well anteriorly. Preserved ovaries.  Trace free fluid in the pelvis. Electronically Signed: By: Karen Kays M.D. On: 05/17/2023 17:20   DG C-Arm 1-60 Min  Result Date: 05/16/2023 CLINICAL DATA:  Fluoro guidance provided EXAM: DG C-ARM 1-60 MIN FINDINGS: Dose: Cumulative Air Kerma 41.3mG y Fluoro time 121s IMPRESSION: C-arm fluoro guidance provided. Electronically Signed   By: Layla Maw M.D.   On: 05/16/2023 16:45   US Pelvis Complete  Result Date: 05/15/2023 CLINICAL DATA:  Right flank pain, enlarged uterus on CT EXAM: TRANSABDOMINAL ULTRASOUND OF PELVIS DOPPLER ULTRASOUND OF OVARIES TECHNIQUE: Transabdominal ultrasound examination of the pelvis was performed including evaluation of the uterus, ovaries, adnexal regions, and pelvic cul-de-sac. Color and duplex Doppler ultrasound was utilized to evaluate blood flow to the ovaries. COMPARISON:  05/15/2023 FINDINGS: Uterus Measurements: 18.3 x 10.9 x 15.5 cm. The uterus is diffusely heterogeneous. No discrete fibroids are visualized on transabdominal imaging. Patient could not tolerate endovaginal exam due to discomfort. Endometrium The endometrial stripe is not well visualized. Right ovary Measurements: 5.1 x 2.3 x 2.3 cm = volume: 14.2 mL. Normal appearance/no adnexal mass. Left ovary Measurements: 3.5 x 1.4 x 2.3 cm = volume: 5.7 mL. Normal appearance/no adnexal mass. Pulsed Doppler evaluation demonstrates normal low-resistance arterial and venous waveforms in both ovaries. Other: No free fluid. IMPRESSION: 1. Markedly enlarged uterus, which demonstrates diffusely heterogeneous echotexture but  no well-defined masses. The endometrial stripe is not well visualized. Given the lack of visible fibroids and the nonvisualization of the endometrium, further evaluation with nonemergent pelvic  MRI as well as gynecologic consultation may be useful to exclude underlying endometrial or uterine neoplasm. 2. Unremarkable appearance of the ovaries.  No evidence of torsion. Electronically Signed   By: Sharlet Salina M.D.   On: 05/15/2023 18:48   [place] Art/Ven Flow Abd Pelv Doppler  Result Date: 05/15/2023 CLINICAL DATA:  Right flank pain, enlarged uterus on CT EXAM: TRANSABDOMINAL ULTRASOUND OF PELVIS DOPPLER ULTRASOUND OF OVARIES TECHNIQUE: Transabdominal ultrasound examination of the pelvis was performed including evaluation of the uterus, ovaries, adnexal regions, and pelvic cul-de-sac. Color and duplex Doppler ultrasound was utilized to evaluate blood flow to the ovaries. COMPARISON:  05/15/2023 FINDINGS: Uterus Measurements: 18.3 x 10.9 x 15.5 cm. The uterus is diffusely heterogeneous. No discrete fibroids are visualized on transabdominal imaging. Patient could not tolerate endovaginal exam due to discomfort. Endometrium The endometrial stripe is not well visualized. Right ovary Measurements: 5.1 x 2.3 x 2.3 cm = volume: 14.2 mL. Normal appearance/no adnexal mass. Left ovary Measurements: 3.5 x 1.4 x 2.3 cm = volume: 5.7 mL. Normal appearance/no adnexal mass. Pulsed Doppler evaluation demonstrates normal low-resistance arterial and venous waveforms in both ovaries. Other: No free fluid. IMPRESSION: 1. Markedly enlarged uterus, which demonstrates diffusely heterogeneous echotexture but no well-defined masses. The endometrial stripe is not well visualized. Given the lack of visible fibroids and the nonvisualization of the endometrium, further evaluation with nonemergent pelvic MRI as well as gynecologic consultation may be useful to exclude underlying endometrial or uterine neoplasm. 2. Unremarkable appearance of the ovaries.  No evidence of torsion. Electronically Signed   By: Sharlet Salina M.D.   On: 05/15/2023 18:48   CT Renal Stone Study  Result Date: 05/15/2023 CLINICAL DATA:  Right-sided  abdominal and flank pain for several days. Vomiting. EXAM: CT ABDOMEN AND PELVIS WITHOUT CONTRAST TECHNIQUE: Multidetector CT imaging of the abdomen and pelvis was performed following the standard protocol without IV contrast. RADIATION DOSE REDUCTION: This exam was performed according to the departmental dose-optimization program which includes automated exposure control, adjustment of the mA and/or kV according to patient size and/or use of iterative reconstruction technique. COMPARISON:  None Available. FINDINGS: Lower chest: No acute findings. Hepatobiliary: No mass visualized on this unenhanced exam. Gallbladder is unremarkable. No evidence of biliary ductal dilatation. Pancreas: No mass or inflammatory process visualized on this unenhanced exam. Spleen:  Within normal limits in size. Adrenals/Urinary tract: Mild to moderate right hydroureteronephrosis is seen no ureteral calculi identified. This appears to be due to mass effect on the distal right ureter by markedly enlarged uterus. Stomach/Bowel: No evidence of obstruction, inflammatory process, or abnormal fluid collections. Normal appendix visualized. Vascular/Lymphatic: No pathologically enlarged lymph nodes identified. No evidence of abdominal aortic aneurysm. Reproductive: Markedly enlarged uterus, likely due to fibroids. Adnexal regions are unremarkable. Tiny amount of free fluid noted in pelvis. Other:  None. Musculoskeletal:  No suspicious bone lesions identified. IMPRESSION: Markedly enlarged uterus, likely due to fibroids. Mild to moderate right hydroureteronephrosis, due to mass effect on the distal right ureter by the markedly enlarged uterus. No ureteral calculi identified. Electronically Signed   By: Danae Orleans M.D.   On: 05/15/2023 16:20      Subjective: Patient seen and examined at bedside.  Complains of intermittent lower abdominal pain but feels okay to go home today.  Complains of constipation. No fever or vomiting  reported.  Discharge Exam: Vitals:   05/17/23 2009 05/18/23 0804  BP: 130/84 121/87  Pulse: 74 72  Resp: 20 18  Temp: 98.2 F (36.8 C) 98 F (36.7 C)  SpO2: 97% 96%    General: Pt is alert, awake, not in acute distress.  On room air. Cardiovascular: rate controlled, S1/S2 + Respiratory: bilateral decreased breath sounds at bases Abdominal: Soft, NT, ND, bowel sounds + Extremities: no edema, no cyanosis    The results of significant diagnostics from this hospitalization (including imaging, microbiology, ancillary and laboratory) are listed below for reference.     Microbiology: No results found for this or any previous visit (from the past 240 hour(s)).   Labs: BNP (last 3 results) No results for input(s): "BNP" in the last 8760 hours. Basic Metabolic Panel: Recent Labs  Lab 05/15/23 1324 05/16/23 0226 05/17/23 0842 05/18/23 0758  NA 138 137 137 137  K 3.8 3.7 4.1 4.2  CL 104 105 107 107  CO2 23 24 25 24   GLUCOSE 106* 120* 99 94  BUN 11 16 10 12   CREATININE 1.30* 1.53* 1.43* 1.15*  CALCIUM 9.7 8.8* 8.7* 8.7*  MG  --   --   --  1.7   Liver Function Tests: Recent Labs  Lab 05/15/23 1324  AST 21  ALT 27  ALKPHOS 58  BILITOT 0.6  PROT 7.6  ALBUMIN 3.9   Recent Labs  Lab 05/15/23 1324  LIPASE 32   No results for input(s): "AMMONIA" in the last 168 hours. CBC: Recent Labs  Lab 05/15/23 1324 05/16/23 0226 05/17/23 0842 05/18/23 0758  WBC 5.6 6.4 9.0 5.8  NEUTROABS  --  3.2  --  2.8  HGB 14.6 12.4 13.0 12.4  HCT 43.6 36.9 38.9 36.3  MCV 87.2 88.1 88.8 87.9  PLT 240 200 200 199   Cardiac Enzymes: No results for input(s): "CKTOTAL", "CKMB", "CKMBINDEX", "TROPONINI" in the last 168 hours. BNP: Invalid input(s): "POCBNP" CBG: No results for input(s): "GLUCAP" in the last 168 hours. D-Dimer No results for input(s): "DDIMER" in the last 72 hours. Hgb A1c No results for input(s): "HGBA1C" in the last 72 hours. Lipid Profile No results for  input(s): "CHOL", "HDL", "LDLCALC", "TRIG", "CHOLHDL", "LDLDIRECT" in the last 72 hours. Thyroid function studies No results for input(s): "TSH", "T4TOTAL", "T3FREE", "THYROIDAB" in the last 72 hours.  Invalid input(s): "FREET3" Anemia work up No results for input(s): "VITAMINB12", "FOLATE", "FERRITIN", "TIBC", "IRON", "RETICCTPCT" in the last 72 hours. Urinalysis    Component Value Date/Time   COLORURINE YELLOW 05/15/2023 2101   APPEARANCEUR CLEAR 05/15/2023 2101   LABSPEC 1.018 05/15/2023 2101   PHURINE 6.0 05/15/2023 2101   GLUCOSEU NEGATIVE 05/15/2023 2101   HGBUR NEGATIVE 05/15/2023 2101   BILIRUBINUR NEGATIVE 05/15/2023 2101   KETONESUR NEGATIVE 05/15/2023 2101   PROTEINUR NEGATIVE 05/15/2023 2101   UROBILINOGEN 1.0 10/02/2013 1700   NITRITE NEGATIVE 05/15/2023 2101   LEUKOCYTESUR NEGATIVE 05/15/2023 2101   Sepsis Labs Recent Labs  Lab 05/15/23 1324 05/16/23 0226 05/17/23 0842 05/18/23 0758  WBC 5.6 6.4 9.0 5.8   Microbiology No results found for this or any previous visit (from the past 240 hour(s)).   Time coordinating discharge: 35 minutes  SIGNED:   Glade Lloyd, MD  [facility] 05/18/2023, 11:33 AM

## 2023-05-19 ENCOUNTER — Other Ambulatory Visit: Payer: Self-pay

## 2023-05-19 ENCOUNTER — Other Ambulatory Visit: Payer: Self-pay | Admitting: Obstetrics and Gynecology

## 2023-05-19 ENCOUNTER — Encounter (HOSPITAL_BASED_OUTPATIENT_CLINIC_OR_DEPARTMENT_OTHER): Payer: Self-pay

## 2023-05-19 ENCOUNTER — Emergency Department (HOSPITAL_BASED_OUTPATIENT_CLINIC_OR_DEPARTMENT_OTHER): Admission: EM | Admit: 2023-05-19 | Discharge: 2023-05-19 | Discharge status: Against Medical Advice | Payer: 59

## 2023-05-19 DIAGNOSIS — R319 Hematuria, unspecified: Secondary | ICD-10-CM | POA: Insufficient documentation

## 2023-05-19 DIAGNOSIS — M7989 Other specified soft tissue disorders: Secondary | ICD-10-CM | POA: Diagnosis not present

## 2023-05-19 DIAGNOSIS — R42 Dizziness and giddiness: Secondary | ICD-10-CM | POA: Insufficient documentation

## 2023-05-19 DIAGNOSIS — R109 Unspecified abdominal pain: Secondary | ICD-10-CM | POA: Insufficient documentation

## 2023-05-19 DIAGNOSIS — Z5321 Procedure and treatment not carried out due to patient leaving prior to being seen by health care provider: Secondary | ICD-10-CM | POA: Diagnosis not present

## 2023-05-19 LAB — COMPREHENSIVE METABOLIC PANEL
ALT: 81 U/L — ABNORMAL HIGH (ref 0–44)
AST: 110 U/L — ABNORMAL HIGH (ref 15–41)
Albumin: 3.9 g/dL (ref 3.5–5.0)
Alkaline Phosphatase: 59 U/L (ref 38–126)
Anion gap: 10 (ref 5–15)
BUN: 18 mg/dL (ref 6–20)
CO2: 23 mmol/L (ref 22–32)
Calcium: 9.3 mg/dL (ref 8.9–10.3)
Chloride: 103 mmol/L (ref 98–111)
Creatinine, Ser: 1.11 mg/dL — ABNORMAL HIGH (ref 0.44–1.00)
GFR, Estimated: >60 mL/min (ref >60)
Glucose, Bld: 99 mg/dL (ref 70–99)
Potassium: 4.1 mmol/L (ref 3.5–5.1)
Sodium: 136 mmol/L (ref 135–145)
Total Bilirubin: 0.5 mg/dL (ref <1.2)
Total Protein: 7.7 g/dL (ref 6.5–8.1)

## 2023-05-19 LAB — CBC
HCT: 40.9 % (ref 36.0–46.0)
Hemoglobin: 14.1 g/dL (ref 12.0–15.0)
MCH: 29.9 pg (ref 26.0–34.0)
MCHC: 34.5 g/dL (ref 30.0–36.0)
MCV: 86.7 fL (ref 80.0–100.0)
Platelets: 244 10*3/uL (ref 150–400)
RBC: 4.72 MIL/uL (ref 3.87–5.11)
RDW: 12.8 % (ref 11.5–15.5)
WBC: 5.1 10*3/uL (ref 4.0–10.5)
nRBC: 0 % (ref 0.0–0.2)

## 2023-05-19 LAB — URINALYSIS, ROUTINE W REFLEX MICROSCOPIC
Bilirubin Urine: NEGATIVE
Glucose, UA: NEGATIVE mg/dL
Ketones, ur: NEGATIVE mg/dL
Nitrite: NEGATIVE
Protein, ur: 100 mg/dL — AB
Specific Gravity, Urine: 1.025 (ref 1.005–1.030)
pH: 6.5 (ref 5.0–8.0)

## 2023-05-19 LAB — URINALYSIS, MICROSCOPIC (REFLEX): RBC / HPF: >50 RBC/hpf (ref 0–5)

## 2023-05-19 LAB — LIPASE, BLOOD: Lipase: 29 U/L (ref 11–51)

## 2023-05-19 LAB — PREGNANCY, URINE: Preg Test, Ur: NEGATIVE

## 2023-05-19 NOTE — Progress Notes (Signed)
Discharge instructions given to pt. Pt verbalized understanding of all teaching and had no further questions. 

## 2023-05-19 NOTE — Plan of Care (Signed)
  Problem: Education: Goal: Knowledge of General Education information will improve Description: Including pain rating scale, medication(s)/side effects and non-pharmacologic comfort measures Outcome: Progressing   Problem: Health Behavior/Discharge Planning: Goal: Ability to manage health-related needs will improve Outcome: Progressing   Problem: Coping: Goal: Level of anxiety will decrease Outcome: Progressing   Problem: Pain Management: Goal: General experience of comfort will improve Outcome: Progressing

## 2023-05-19 NOTE — ED Notes (Signed)
Pt told staff she was leaving and wanted her IV removed. [place], NT removed her IV.

## 2023-05-19 NOTE — ED Triage Notes (Addendum)
Pt reports pelvic pain, right flank pain, hematuria and reports no bowel movement in 2 weeks. Hx of urinary stent placed on Sunday morning. She was discharged from [facility] today. She has been taking prescribed oxycodone and tylenol without relief. She also reports dizziness and bilateral leg swelling. She also has been having vaginal bleeding as well, hx of 15.3cm Uterine Fibroid as shown on the MRI of her pelvis done two days ago.

## 2023-05-20 ENCOUNTER — Telehealth: Payer: Self-pay | Admitting: Urology

## 2023-05-20 NOTE — Telephone Encounter (Signed)
Was seen in the ER on 05/15/2023 Pt wants to know if it is normal for her to still have blood coming from her urine.

## 2023-05-20 NOTE — Telephone Encounter (Signed)
Pt has a ABDOMINAL MYOMECTOMY  scheduled or same day as appt with our office. Wants to know:  If she needs to come in for appt If stent should be removed before this Or is it ok to sch out.  Did not ask her Dr performing procedure what she should do.  There are available appts tomorrow 11/22, should I try and schedule for then? The schedules are full until December.   Thank you

## 2023-05-20 NOTE — Telephone Encounter (Signed)
Spoke with patient, she will follow up here after her surgery. Appt scheduled.

## 2023-05-21 ENCOUNTER — Telehealth: Payer: Self-pay | Admitting: Urology

## 2023-05-21 NOTE — Telephone Encounter (Signed)
Pt was told by her GYN that the stent would be removed during her procedure on 05/31/23. Do you need to be there?

## 2023-05-24 NOTE — Telephone Encounter (Signed)
Left msg with Dr. Irineo Axon assistant to verify if the stent is to be removed during procedure.

## 2023-05-25 ENCOUNTER — Encounter (HOSPITAL_COMMUNITY): Payer: Self-pay | Admitting: Obstetrics & Gynecology

## 2023-05-25 ENCOUNTER — Other Ambulatory Visit: Payer: Self-pay

## 2023-05-25 ENCOUNTER — Other Ambulatory Visit: Payer: Self-pay | Admitting: Obstetrics & Gynecology

## 2023-05-25 NOTE — Progress Notes (Signed)
SDW CALL  Patient was given pre-op instructions over the phone. The opportunity was given for the patient to ask questions. No further questions asked. Patient verbalized understanding of instructions given.   PCP - denies Cardiologist - denies  PPM/ICD - denies   Chest x-ray - denies EKG - denies Stress Test - denies ECHO - denies Cardiac Cath -denies   Sleep Study - denies  Fasting Blood Sugar - denies   Last dose of GLP1 agonist-  n/a GLP1 instructions: n/a  Blood Thinner Instructions: n/a Aspirin Instructions: n/a  ERAS Protcol - clears until 0700   COVID TEST- n/a   Anesthesia review: no  Patient denies shortness of breath, fever, cough and chest pain over the phone call   All instructions explained to the patient, with a verbal understanding of the material. Patient agrees to go over the instructions while at home for a better understanding.

## 2023-05-25 NOTE — Progress Notes (Signed)
Surgical Instructions   Your procedure is scheduled on Monday, December 2nd, 2024. Report to [facility] Main Entrance "A" at 7:30 A.M., then check in with the Admitting office. Any questions or running late day of surgery: call [phone]  Questions prior to your surgery date: call [phone], Monday-Friday, 8am-4pm. If you experience any cold or flu symptoms such as cough, fever, chills, shortness of breath, etc. between now and your scheduled surgery, please notify us at the above number.     Remember:  Do not eat after midnight the night before your surgery   You may drink clear liquids until 7:00 the morning of your surgery.   Clear liquids allowed are: Water, Non-Citrus Juices (without pulp), Carbonated Beverages, Clear Tea (no milk, honey, etc.), Black Coffee Only (NO MILK, CREAM OR POWDERED CREAMER of any kind), and Gatorade.    Take these medicines the morning of surgery with A SIP OF WATER: None   May take these medicines IF NEEDED: Tylenol Oxycodone    One week prior to surgery, STOP taking any Aspirin (unless otherwise instructed by your surgeon) Aleve, Naproxen, Ibuprofen, Motrin, Advil, Goody's, BC's, all herbal medications, fish oil, and non-prescription vitamins.            You will be asked to remove any contacts, glasses, piercing's, hearing aid's, dentures/partials prior to surgery. Please bring cases for these items if needed.    Patients discharged the day of surgery will not be allowed to drive home, and someone needs to stay with them for 24 hours.  SURGICAL WAITING ROOM VISITATION Patients may have no more than 2 support people in the waiting area - these visitors may rotate.   Pre-op nurse will coordinate an appropriate time for 1 ADULT support person, who may not rotate, to accompany patient in pre-op.  Children under the age of 50 must have an adult with them who is not the patient and must remain in the main waiting area with an adult.  If the  patient needs to stay at the hospital during part of their recovery, the visitor guidelines for inpatient rooms apply.  Please refer to the [facility] website for the visitor guidelines for any additional information.   If you received a COVID test during your pre-op visit  it is requested that you wear a mask when out in public, stay away from anyone that may not be feeling well and notify your surgeon if you develop symptoms. If you have been in contact with anyone that has tested positive in the last 10 days please notify you surgeon.

## 2023-05-25 NOTE — Telephone Encounter (Signed)
Called the surgery scheduler at Dr. Irineo Axon office to ask of the stent was supposed to be removed during procedure on 05/31/23.   Left msg for a return call.

## 2023-05-26 NOTE — Telephone Encounter (Signed)
Pt aware that the stent will be staying in until after the surgery. She will come to our office and have it removed on 06/10/23. Answered all pt questions about procedure.

## 2023-05-30 NOTE — Anesthesia Preprocedure Evaluation (Addendum)
Anesthesia Evaluation  Patient identified by MRN, date of birth, ID band Patient awake    Reviewed: Allergy & Precautions, NPO status , Patient's Chart, lab work & pertinent test results  History of Anesthesia Complications Negative for: history of anesthetic complications  Airway Mallampati: I  TM Distance: >3 FB Neck ROM: Full    Dental  (+) Dental Advisory Given, Teeth Intact   Pulmonary neg pulmonary ROS   breath sounds clear to auscultation       Cardiovascular negative cardio ROS  Rhythm:Regular Rate:Normal     Neuro/Psych negative neurological ROS     GI/Hepatic negative GI ROS,,,New elevation of LFTs: back to normal today   Endo/Other  BMI 29.5  Renal/GU Renal disease (recent ureteral obstruction and stent)     Musculoskeletal   Abdominal   Peds  Hematology negative hematology ROS (+)   Anesthesia Other Findings   Reproductive/Obstetrics                             Anesthesia Physical Anesthesia Plan  ASA: 2  Anesthesia Plan: General   Post-op Pain Management: Toradol IV (intra-op)*   Induction: Intravenous  PONV Risk Score and Plan: 2 and Ondansetron, Dexamethasone and Scopolamine patch - Pre-op  Airway Management Planned: Oral ETT  Additional Equipment: None  Intra-op Plan:   Post-operative Plan: Extubation in OR  Informed Consent: I have reviewed the patients History and Physical, chart, labs and discussed the procedure including the risks, benefits and alternatives for the proposed anesthesia with the patient or authorized representative who has indicated his/her understanding and acceptance.     Dental advisory given  Plan Discussed with: CRNA and Surgeon  Anesthesia Plan Comments:        Anesthesia Quick Evaluation

## 2023-05-30 NOTE — Op Note (Signed)
Name: Lisa Herring  DOB: 10-24-1990   MRN: [id number]  Pre-op diagnosis: 1. Intramural fibroids. 2. Pelvic pain. 3. Right hydronephrosis, s/p right ureteral stent placement.   Post-op diagnosis: Same as above.   Procedures:  Abdominal myomectomy.   Anesthesia:  General ETA, Dr. Jean Rosenthal, [place].  Surgeon: Dr. Hoover Browns.   Assistant: Dr. Osborn Coho.   Surgeon Attestation: An experienced assistant was required given the standard of surgical care given the complexity of the case.  This assistant was needed for exposure, dissection, suctioning, retraction, instrument exchange and for overall help during the procedure   Complications: None  Findings:  Uterus with multiple uterine fibroids: one 15 cm intramural fibroid located fundally and posteriorly, one 2cm intramural fibroids located left and anteriorly.  Three small sub centimenter subserosal fibroids located on the right anterior uterus (total weight of fibroids 737 grams).    Normal bilateral ovaries and fallopian tubes. Normal bowels.    EBL:   790 cc.   IVF: 1400 cc LR. 500 cc albumin.   Urine output:  600 cc blood tinged urine (noted from beginning of procedure).    Indications: 32 y/o P0 here for abdominal myomectomy for symptomatic fibroids.     PROCEDURE: Informed consent was obtained from the patient to undergo the procedure after explaining the risks, benefits and alternatives of the procedures.  She was prepped vaginally with betadine and abdominally with duraprep and draped in the usual sterile fashion and a foley catheter was placed in.    IV Ancef, IV tranexamic acid and rectal cytotec were given pre-operatively.  A 16 gauge foley was placed trans cervically and into the uterus and 10  cc of methylene blue dye instilled in the bulb.  Single tooth tenaculum was then placed on anterior cervix.   Attention was then turned to the abdomen where a pfannenstiel incision was made with the scalpel and carried  through the underlying subcutaneous layer and fascia with the bovie.  Small perforators were contained with the bovie.  The fascia was nicked in the midline and the fascia separated from the rectus muscles superiorly, inferiorly and bilaterally.  Rectus muscle was separated in the midline, peritoneum entered and the uterus was inspected and the fibroids noted.  The Alexis retractor was placed in.  Vasopressin 20 units mixed in 100 cc NS was instilled into the myoma surface.  Uterine screw and tenaculum clamps were placed fundally into the fibroid and attempt was made to mobilize the uterus out of the pelvis, with only partial mobilization.  Myomectomy then proceeded the the partially delivered fibroid.  More vasopressin was instilled over the fibroid.  The large posterior fibroid was incised vertically with the bovie and the fibroid and pseudocapsule was separated from the sorrounding myometrium.  As the fibroid got separated from the myometrium, with continued traction on the large  fibroid the fibroids and uterus were then fully delivered from the pelvis.  The large fibroid was then fully separated from the myometrium.  Endometrial cavity entry was noted after complete removal of the fibroid as the fibroid extended to the endometrium (FIGO type 2 - 5). In case of future pregnancy a cesarean section is recommended for delivery as the endometrial cavity was entered during the myomectomy.  The intrauterine foley was deflated and removed intact.  The endometrial later was closed off with 3-0 vicryl in a running stitch. The remaining large uterine defect was closed off with 0-vicryl in 3 layers of running stitch. Baseball stitch with 3-0  vicryl; was then placed on serosa.  Similar procedure was done to remove 1 more small intramural fibroids on the left anterior surface (FIGO type 4). This defect was closed off with 0-vicryl suture. A small cluster of surface myomas on the right anterior uterus (FIGO type 7) were  resected with the bovie. The serosal surface was made hemostatic with the bovie.   The pelvis was irrigated and suctioned out.   Floseal then interceed were then placed over the myometrium incisions where the fibroids had been removed.  Jon Gills was removed and the muscle and peritoneum was reapproximated with 0-chromic. The fascia was closed off using 0-vicryl. The subcutaneous layer was closed off with plain suture.  The skin was closed off with 4-0 vicryl  on the keith needle in a subcuticular stitch.  Benzocaine, steri strips and honey comb dressing were then placed over the incision.  The patient was awoken from anesthesia and taken to the recovery room in stable condition.   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   Dr. Hoover Browns.  05/31/2023.

## 2023-05-30 NOTE — H&P (Incomplete)
Lisa Herring is an 32 y.o. female G0 with uterus with fibroids, pelvic pain,. Right hydronephrosis and s/p ureteral stent placement here for an abdominal myomectomy.   Pertinent Gynecological History: Menses: monthly Bleeding: Not present currently.  Contraception: OCP (estrogen/progesterone) (stopped about 2 weeks ago) DES exposure: unknown Blood transfusions: none Sexually transmitted diseases: no past history Previous GYN Procedures:  None.   Last mammogram:  N/A   Last pap: normal Date: 06/10/22  Menstrual History: Menarche age: 20 Patient's last menstrual period was 05/08/2023.    Past Medical History:  Diagnosis Date   Uterine fibroid     Past Surgical History:  Procedure Laterality Date   CYSTOSCOPY W/ URETERAL STENT PLACEMENT Right 05/16/2023   Procedure: CYSTOSCOPY WITH RETROGRADE PYELOGRAM/URETERAL STENT PLACEMENT;  Surgeon: Joline Maxcy, MD;  Location: [facility] OR;  Service: Urology;  Laterality: Right;   WISDOM TOOTH EXTRACTION      History reviewed. No pertinent family history.  Social History:  reports that she has never smoked. She has never used smokeless tobacco. She reports current alcohol use. She reports that she does not use drugs.  Allergies: No Known Allergies  Current Outpatient Medications  Medication Instructions   acetaminophen (TYLENOL) 1,000 mg, Oral, Every 6 hours   Aspirin-Acetaminophen-Caffeine (GOODY HEADACHE PO) 1 Package, Oral, Daily PRN   bisacodyl (DULCOLAX) 5 mg, Oral, Daily PRN   ibuprofen (ADVIL) 800 mg, Oral, Every 8 hours PRN   oxyCODONE (OXY IR/ROXICODONE) 5 mg, Oral, Every 4 hours PRN   polyethylene glycol (MIRALAX / GLYCOLAX) 17 g, Oral, Daily PRN   senna-docusate (SENOKOT-S) 8.6-50 MG tablet 2 tablets, Oral, At bedtime PRN    No Known Allergies   Review of Systems  Constitutional: Denies fevers/chills Cardiovascular: Denies chest pain or palpitations Pulmonary: Denies coughing or wheezing Gastrointestinal: Denies  nausea, vomiting or diarrhea Genitourinary: Denies unusual vaginal bleeding, unusual vaginal discharge, dysuria, urgency or frequency. With pelvic pain, with right flank pain.  Musculoskeletal: Denies muscle or joint aches and pain.  Neurology: Denies abnormal sensations such as tingling or numbness.   Last menstrual period 05/08/2023. Physical Exam  Physical Exam  Vitals reviewed. Constitutional: She is oriented to person, place, and time. She appears well-developed and well-nourished.  HENT:  Head: Normocephalic and atraumatic.  Eyes: Conjunctivae and EOM are normal.  Neck: Normal range of motion. Neck supple.  Cardiovascular: Normal rate, regular rhythm, normal heart sounds and intact distal pulses.  Respiratory: Effort normal and breath sounds normal.  GI: Soft. She exhibits no mass. There is no tenderness.  Genitourinary: Uterus is 20 weeks sized, enlarged with fibroid.   Musculoskeletal: Normal range of motion.  Neurological: She is alert and oriented to person, place, and time.  Skin: Skin is warm and dry.  Psychiatric: She has a normal mood and affect. Judgment normal.     Recent Results (from the past 2160 hour(s))  Lipase, blood     Status: None   Collection Time: 05/15/23  1:24 PM  Result Value Ref Range   Lipase 32 11 - 51 U/L    Comment: Performed at [facility], [address], [place], [place] [address]  Comprehensive metabolic panel     Status: Abnormal   Collection Time: 05/15/23  1:24 PM  Result Value Ref Range   Sodium 138 135 - 145 mmol/L   Potassium 3.8 3.5 - 5.1 mmol/L   Chloride 104 98 - 111 mmol/L   CO2 23 22 - 32 mmol/L   Glucose, Bld 106 (  H) 70 - 99 mg/dL    Comment: Glucose reference range applies only to samples taken after fasting for at least 8 hours.   BUN 11 6 - 20 mg/dL   Creatinine, Ser 7.82 (H) 0.44 - 1.00 mg/dL   Calcium 9.7 8.9 - 95.6 mg/dL   Total Protein 7.6 6.5 - 8.1 g/dL   Albumin 3.9 3.5 - 5.0 g/dL   AST 21 15 - 41 U/L    ALT 27 0 - 44 U/L   Alkaline Phosphatase 58 38 - 126 U/L   Total Bilirubin 0.6 <1.2 mg/dL   GFR, Estimated 56 (L) >60 mL/min    Comment: (NOTE) Calculated using the CKD-EPI Creatinine Equation (2021)    Anion gap 11 5 - 15    Comment: Performed at [facility], [address] [place], [place] [address]  CBC     Status: None   Collection Time: 05/15/23  1:24 PM  Result Value Ref Range   WBC 5.6 4.0 - 10.5 K/uL   RBC 5.00 3.87 - 5.11 MIL/uL   Hemoglobin 14.6 12.0 - 15.0 g/dL   HCT 65.7 84.6 - 96.2 %   MCV 87.2 80.0 - 100.0 fL   MCH 29.2 26.0 - 34.0 pg   MCHC 33.5 30.0 - 36.0 g/dL   RDW 95.2 84.1 - 32.4 %   Platelets 240 150 - 400 K/uL   nRBC 0.0 0.0 - 0.2 %    Comment: Performed at [facility], [address], [place], [place] [address]  hCG, serum, qualitative     Status: None   Collection Time: 05/15/23  1:24 PM  Result Value Ref Range   Preg, Serum NEGATIVE NEGATIVE    Comment:        THE SENSITIVITY OF THIS METHODOLOGY IS >10 mIU/mL. Performed at [facility], [address] [place], [place] [address]   Urinalysis, Routine w reflex microscopic -Urine, Clean Catch     Status: None   Collection Time: 05/15/23  9:01 PM  Result Value Ref Range   Color, Urine YELLOW YELLOW   APPearance CLEAR CLEAR   Specific Gravity, Urine 1.018 1.005 - 1.030   pH 6.0 5.0 - 8.0   Glucose, UA NEGATIVE NEGATIVE mg/dL   Hgb urine dipstick NEGATIVE NEGATIVE   Bilirubin Urine NEGATIVE NEGATIVE   Ketones, ur NEGATIVE NEGATIVE mg/dL   Protein, ur NEGATIVE NEGATIVE mg/dL   Nitrite NEGATIVE NEGATIVE   Leukocytes,Ua NEGATIVE NEGATIVE    Comment: Performed at [facility], [address] [place], [place] [address]  Basic metabolic panel     Status: Abnormal   Collection Time: 05/16/23  2:26 AM  Result Value Ref Range   Sodium 137 135 - 145 mmol/L   Potassium 3.7 3.5 - 5.1 mmol/L   Chloride 105 98 - 111 mmol/L   CO2 24 22 - 32 mmol/L   Glucose, Bld 120 (H) 70 - 99  mg/dL    Comment: Glucose reference range applies only to samples taken after fasting for at least 8 hours.   BUN 16 6 - 20 mg/dL   Creatinine, Ser 4.74 (H) 0.44 - 1.00 mg/dL   Calcium 8.8 (L) 8.9 - 10.3 mg/dL   GFR, Estimated 46 (L) >60 mL/min    Comment: (NOTE) Calculated using the CKD-EPI Creatinine Equation (2021)    Anion gap 8 5 - 15    Comment: Performed at [facility], [address], [place], [place] [address]  CBC with Differential/Platelet     Status: None   Collection Time: 05/16/23  2:26 AM  Result Value Ref Range   WBC 6.4 4.0 - 10.5 K/uL   RBC 4.19 3.87 - 5.11 MIL/uL   Hemoglobin 12.4 12.0 - 15.0 g/dL   HCT 47.8 29.5 - 62.1 %   MCV 88.1 80.0 - 100.0 fL   MCH 29.6 26.0 - 34.0 pg   MCHC 33.6 30.0 - 36.0 g/dL   RDW 30.8 65.7 - 84.6 %   Platelets 200 150 - 400 K/uL   nRBC 0.0 0.0 - 0.2 %   Neutrophils Relative % 49 %   Neutro Abs 3.2 1.7 - 7.7 K/uL   Lymphocytes Relative 38 %   Lymphs Abs 2.4 0.7 - 4.0 K/uL   Monocytes Relative 10 %   Monocytes Absolute 0.6 0.1 - 1.0 K/uL   Eosinophils Relative 2 %   Eosinophils Absolute 0.1 0.0 - 0.5 K/uL   Basophils Relative 1 %   Basophils Absolute 0.0 0.0 - 0.1 K/uL   Immature Granulocytes 0 %   Abs Immature Granulocytes 0.01 0.00 - 0.07 K/uL    Comment: Performed at [facility], [address], [place], [place] [address]  CBC     Status: None   Collection Time: 05/17/23  8:42 AM  Result Value Ref Range   WBC 9.0 4.0 - 10.5 K/uL   RBC 4.38 3.87 - 5.11 MIL/uL   Hemoglobin 13.0 12.0 - 15.0 g/dL   HCT 28.4 13.2 - 44.0 %   MCV 88.8 80.0 - 100.0 fL   MCH 29.7 26.0 - 34.0 pg   MCHC 33.4 30.0 - 36.0 g/dL   RDW 10.2 72.5 - 36.6 %   Platelets 200 150 - 400 K/uL   nRBC 0.0 0.0 - 0.2 %    Comment: Performed at [facility], [address] [place], [place] [address]  Basic metabolic panel     Status: Abnormal   Collection Time: 05/17/23  8:42 AM  Result Value Ref Range   Sodium 137 135 - 145 mmol/L    Potassium 4.1 3.5 - 5.1 mmol/L   Chloride 107 98 - 111 mmol/L   CO2 25 22 - 32 mmol/L   Glucose, Bld 99 70 - 99 mg/dL    Comment: Glucose reference range applies only to samples taken after fasting for at least 8 hours.   BUN 10 6 - 20 mg/dL   Creatinine, Ser 7.42 (H) 0.44 - 1.00 mg/dL   Calcium 8.7 (L) 8.9 - 10.3 mg/dL   GFR, Estimated 50 (L) >60 mL/min    Comment: (NOTE) Calculated using the CKD-EPI Creatinine Equation (2021)    Anion gap 5 5 - 15    Comment: Performed at [facility], [address] [place], [place] [address]  CBC with Differential/Platelet     Status: None   Collection Time: 05/18/23  7:58 AM  Result Value Ref Range   WBC 5.8 4.0 - 10.5 K/uL   RBC 4.13 3.87 - 5.11 MIL/uL   Hemoglobin 12.4 12.0 - 15.0 g/dL   HCT 87.5 64.3 - 32.9 %   MCV 87.9 80.0 - 100.0 fL   MCH 30.0 26.0 - 34.0 pg   MCHC 34.2 30.0 - 36.0 g/dL   RDW 51.8 84.1 - 66.0 %   Platelets 199 150 - 400 K/uL   nRBC 0.0 0.0 - 0.2 %   Neutrophils Relative % 49 %   Neutro Abs  2.8 1.7 - 7.7 K/uL   Lymphocytes Relative 42 %   Lymphs Abs 2.4 0.7 - 4.0 K/uL   Monocytes Relative 8 %   Monocytes Absolute 0.4 0.1 - 1.0 K/uL   Eosinophils Relative 1 %   Eosinophils Absolute 0.1 0.0 - 0.5 K/uL   Basophils Relative 0 %   Basophils Absolute 0.0 0.0 - 0.1 K/uL   Immature Granulocytes 0 %   Abs Immature Granulocytes 0.01 0.00 - 0.07 K/uL    Comment: Performed at [facility], [address] [place], [place] [address]  Basic metabolic panel     Status: Abnormal   Collection Time: 05/18/23  7:58 AM  Result Value Ref Range   Sodium 137 135 - 145 mmol/L   Potassium 4.2 3.5 - 5.1 mmol/L   Chloride 107 98 - 111 mmol/L   CO2 24 22 - 32 mmol/L   Glucose, Bld 94 70 - 99 mg/dL    Comment: Glucose reference range applies only to samples taken after fasting for at least 8 hours.   BUN 12 6 - 20 mg/dL   Creatinine, Ser 5.63 (H) 0.44 - 1.00 mg/dL   Calcium 8.7 (L) 8.9 - 10.3 mg/dL   GFR, Estimated >87  >56 mL/min    Comment: (NOTE) Calculated using the CKD-EPI Creatinine Equation (2021)    Anion gap 6 5 - 15    Comment: Performed at [facility], [address], [place], [place] [address]  Magnesium     Status: None   Collection Time: 05/18/23  7:58 AM  Result Value Ref Range   Magnesium 1.7 1.7 - 2.4 mg/dL    Comment: Performed at [facility], [address] [place], [place] [address]  Lipase, blood     Status: None   Collection Time: 05/19/23  6:45 PM  Result Value Ref Range   Lipase 29 11 - 51 U/L    Comment: Performed at [facility], [address] [place], [place] [address]  Comprehensive metabolic panel     Status: Abnormal   Collection Time: 05/19/23  6:45 PM  Result Value Ref Range   Sodium 136 135 - 145 mmol/L   Potassium 4.1 3.5 - 5.1 mmol/L   Chloride 103 98 - 111 mmol/L   CO2 23 22 - 32 mmol/L   Glucose, Bld 99 70 - 99 mg/dL    Comment: Glucose reference range applies only to samples taken after fasting for at least 8 hours.   BUN 18 6 - 20 mg/dL   Creatinine, Ser 3.01 (H) 0.44 - 1.00 mg/dL   Calcium 9.3 8.9 - 60.1 mg/dL   Total Protein 7.7 6.5 - 8.1 g/dL   Albumin 3.9 3.5 - 5.0 g/dL   AST 093 (H) 15 - 41 U/L   ALT 81 (H) 0 - 44 U/L   Alkaline Phosphatase 59 38 - 126 U/L   Total Bilirubin 0.5 <1.2 mg/dL   GFR, Estimated >23 >55 mL/min    Comment: (NOTE) Calculated using the CKD-EPI Creatinine Equation (2021)    Anion gap 10 5 - 15    Comment: Performed at [facility], [address] [place], [place] [address]  CBC     Status: None   Collection Time: 05/19/23  6:45 PM  Result Value Ref Range   WBC 5.1 4.0 - 10.5 K/uL   RBC 4.72 3.87 - 5.11 MIL/uL   Hemoglobin 14.1 12.0 - 15.0 g/dL  HCT 40.9 36.0 - 46.0 %   MCV 86.7 80.0 - 100.0 fL   MCH 29.9 26.0 - 34.0 pg   MCHC 34.5 30.0 - 36.0 g/dL   RDW 65.7 84.6 - 96.2 %   Platelets 244 150 - 400 K/uL   nRBC 0.0 0.0 - 0.2 %    Comment: Performed at [facility],  [address] [place], [place] [address]  Urinalysis, Routine w reflex microscopic -Urine, Clean Catch     Status: Abnormal   Collection Time: 05/19/23  6:45 PM  Result Value Ref Range   Color, Urine BROWN (A) YELLOW    Comment: BIOCHEMICALS MAY BE AFFECTED BY COLOR   APPearance CLOUDY (A) CLEAR   Specific Gravity, Urine 1.025 1.005 - 1.030   pH 6.5 5.0 - 8.0   Glucose, UA NEGATIVE NEGATIVE mg/dL   Hgb urine dipstick LARGE (A) NEGATIVE   Bilirubin Urine NEGATIVE NEGATIVE   Ketones, ur NEGATIVE NEGATIVE mg/dL   Protein, ur 132 (A) NEGATIVE mg/dL   Nitrite NEGATIVE NEGATIVE   Leukocytes,Ua SMALL (A) NEGATIVE    Comment: Performed at [facility], [address] [place], [place] [address]  Pregnancy, urine     Status: None   Collection Time: 05/19/23  6:45 PM  Result Value Ref Range   Preg Test, Ur NEGATIVE NEGATIVE    Comment:        THE SENSITIVITY OF THIS METHODOLOGY IS >25 mIU/mL. Performed at [facility], [address] [place], [place] [address]   Urinalysis, Microscopic (reflex)     Status: Abnormal   Collection Time: 05/19/23  6:45 PM  Result Value Ref Range   RBC / HPF >50 0 - 5 RBC/hpf   WBC, UA 6-10 0 - 5 WBC/hpf   Bacteria, UA FEW (A) NONE SEEN   Squamous Epithelial / HPF 6-10 0 - 5 /HPF    Comment: Performed at [facility], [address], [place], [place] [address]    ADDENDUM REPORT: 05/17/2023 18:41   ADDENDUM: Additional details. The myometrium is significantly stretched by the posterior left fibroid. There is a very thin rind of preserved myometrium suggested along the left side of the uterus as thin as 5 mm for example series 5, image 19. There is more preserved myometrium albeit stretched by the fibroid an other locations.   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 The previous right-sided renal collecting system dilatation is no longer seen. By history the patient has a ureteral stent.  There is new mild left-sided renal collecting system dilatation.     Electronically Signed   By: Karen Kays M.D.   On: 05/17/2023 18:41    Addended by Carmina Miller, MD on 05/17/2023  6:44 PM    Study Result  Narrative & Impression  CLINICAL DATA:  Uterine fibroid   EXAM: MRI PELVIS WITHOUT AND WITH CONTRAST   TECHNIQUE: Multiplanar multisequence MR imaging of the pelvis was performed both before and after administration of intravenous contrast.   CONTRAST:  9mL GADAVIST GADOBUTROL 1 MMOL/ML IV SOLN   COMPARISON:  None Available.   FINDINGS: Urinary Tract: Underdistended urinary bladder. Slight wall thickening. Foley catheter in place with some dependent air. Preserved expected course of the urethra.   Bowel: Visualized bowel in the pelvis is nondilated. Scattered colonic stool.   Vascular/Lymphatic: Normal caliber visualized abdominal aorta and iliac vessels. Few small inguinal lymph  nodes identified, nonpathologic by size criteria. No specific pathologic pelvic nodes otherwise. Prominent periuterine vessels.   Reproductive:   Uterus: Measures uterus measures 19.0 x 11.0 x 13.8 cm. There is large central heterogeneous mass which is predominantly dark on precontrast T2, isointense on T1. The lesion has significant enhancement on the dynamic examination consistent with a large enhancing fibroid this has measure proximally 11.6 by 10.0 by 15.3 cm. There is separate smaller midbody anterior fibroid identified measuring 12 mm with similar signal and enhancement criteria. Large fibroid does distort the underlying endometrium, poorly seen today. The endometrium appears displaced anterior right and is grossly thin. Evaluation of the junctional zone is limited.   Right ovary: Small follicles are present. Ovary measures 20 x 18 by 34 mm. Normal enhancement.   Left ovary: Small left-sided follicles are seen. Left ovary measures 3.0 x 1.6 by 2.0 cm. Preserved  enhancement.   Other: Trace free fluid in the pelvis.   Musculoskeletal:  Unremarkable.   IMPRESSION: Large heterogeneous uterine fibroid measuring up to 15.3 cm with distortion of the underlying endometrium, subendometrial component. There is significant enhancement of the fibroid. Smaller fibroid seen elsewhere as well anteriorly.   Preserved ovaries.  Trace free fluid in the pelvis.   Electronically Signed: By: Karen Kays M.D. On: 05/17/2023 17:20     Assessment/Plan: 32 y/o G0  with symptomatic fibroids, here for an abdominal myomectomy  -This procedure has been fully reviewed with the patient and written informed consent has been obtained.  - Discussed with patient risks that include but are not limited to risks of bleeding, infection, damage to organs, blood transfusion, hysterectomy, need for cesarean delivery in future pregnancies.  All her questions were answered and she expressed understanding.    Prescilla Sours, MD.  05/30/2023, 9:35 PM

## 2023-05-31 ENCOUNTER — Inpatient Hospital Stay (HOSPITAL_COMMUNITY)
Admission: RE | Admit: 2023-05-31 | Discharge: 2023-06-02 | DRG: 742 | Disposition: A | Discharge status: Home / Self Care | Payer: 59 | Attending: Obstetrics & Gynecology | Admitting: Obstetrics & Gynecology

## 2023-05-31 ENCOUNTER — Other Ambulatory Visit: Payer: Self-pay

## 2023-05-31 ENCOUNTER — Inpatient Hospital Stay (HOSPITAL_COMMUNITY): Payer: 59 | Admitting: Anesthesiology

## 2023-05-31 ENCOUNTER — Ambulatory Visit: Payer: 59 | Admitting: Urology

## 2023-05-31 ENCOUNTER — Encounter (HOSPITAL_COMMUNITY)
Admission: RE | Disposition: A | Discharge status: Home / Self Care | Payer: Self-pay | Source: Home / Self Care | Attending: Obstetrics & Gynecology

## 2023-05-31 ENCOUNTER — Encounter (HOSPITAL_COMMUNITY): Payer: Self-pay | Admitting: Obstetrics & Gynecology

## 2023-05-31 DIAGNOSIS — N1330 Unspecified hydronephrosis: Secondary | ICD-10-CM | POA: Diagnosis present

## 2023-05-31 DIAGNOSIS — D259 Leiomyoma of uterus, unspecified: Secondary | ICD-10-CM

## 2023-05-31 DIAGNOSIS — D251 Intramural leiomyoma of uterus: Secondary | ICD-10-CM | POA: Diagnosis present

## 2023-05-31 DIAGNOSIS — N852 Hypertrophy of uterus: Principal | ICD-10-CM

## 2023-05-31 DIAGNOSIS — D252 Subserosal leiomyoma of uterus: Secondary | ICD-10-CM | POA: Diagnosis present

## 2023-05-31 DIAGNOSIS — Z9889 Other specified postprocedural states: Secondary | ICD-10-CM

## 2023-05-31 DIAGNOSIS — Z01818 Encounter for other preprocedural examination: Secondary | ICD-10-CM

## 2023-05-31 HISTORY — PX: MYOMECTOMY: SHX85

## 2023-05-31 LAB — COMPREHENSIVE METABOLIC PANEL
ALT: 35 U/L (ref 0–44)
AST: 34 U/L (ref 15–41)
Albumin: 3.7 g/dL (ref 3.5–5.0)
Alkaline Phosphatase: 55 U/L (ref 38–126)
Anion gap: 8 (ref 5–15)
BUN: 8 mg/dL (ref 6–20)
CO2: 24 mmol/L (ref 22–32)
Calcium: 9.5 mg/dL (ref 8.9–10.3)
Chloride: 104 mmol/L (ref 98–111)
Creatinine, Ser: 0.97 mg/dL (ref 0.44–1.00)
GFR, Estimated: >60 mL/min (ref >60)
Glucose, Bld: 99 mg/dL (ref 70–99)
Potassium: 4.5 mmol/L (ref 3.5–5.1)
Sodium: 136 mmol/L (ref 135–145)
Total Bilirubin: 0.9 mg/dL (ref <1.2)
Total Protein: 6.8 g/dL (ref 6.5–8.1)

## 2023-05-31 LAB — POCT PREGNANCY, URINE: Preg Test, Ur: NEGATIVE

## 2023-05-31 LAB — TYPE AND SCREEN
ABO/RH(D): O NEG
Antibody Screen: NEGATIVE

## 2023-05-31 LAB — ABO/RH: ABO/RH(D): O NEG

## 2023-05-31 SURGERY — MYOMECTOMY, ABDOMINAL APPROACH
Anesthesia: General | Site: Abdomen

## 2023-05-31 MED ORDER — HYDROMORPHONE HCL 1 MG/ML IJ SOLN
INTRAMUSCULAR | Status: AC
  Filled 2023-05-31: qty 1

## 2023-05-31 MED ORDER — SODIUM CHLORIDE (PF) 0.9 % IJ SOLN
INTRAMUSCULAR | Status: AC
Start: 2023-05-31 — End: ?
  Filled 2023-05-31: qty 100

## 2023-05-31 MED ORDER — ONDANSETRON HCL 4 MG PO TABS: 4.0000 mg | ORAL_TABLET | Freq: Four times a day (QID) | ORAL | Status: DC | PRN

## 2023-05-31 MED ORDER — PHENYLEPHRINE 80 MCG/ML (10ML) SYRINGE FOR IV PUSH (FOR BLOOD PRESSURE SUPPORT)
PREFILLED_SYRINGE | INTRAVENOUS | Status: DC | PRN
  Administered 2023-05-31 (×6): 80 ug via INTRAVENOUS

## 2023-05-31 MED ORDER — MIDAZOLAM HCL 2 MG/2ML IJ SOLN: 0.5000 mg | Freq: Once | INTRAMUSCULAR | Status: DC | PRN

## 2023-05-31 MED ORDER — FENTANYL CITRATE (PF) 250 MCG/5ML IJ SOLN
INTRAMUSCULAR | Status: AC
  Filled 2023-05-31: qty 5

## 2023-05-31 MED ORDER — OXYCODONE HCL 5 MG/5ML PO SOLN: 5.0000 mg | Freq: Once | ORAL | Status: DC | PRN

## 2023-05-31 MED ORDER — SODIUM CHLORIDE 0.9% FLUSH
9.0000 mL | INTRAVENOUS | Status: DC | PRN
Start: 2023-05-31 — End: 2023-06-01

## 2023-05-31 MED ORDER — KETOROLAC TROMETHAMINE 30 MG/ML IJ SOLN
30.0000 mg | Freq: Four times a day (QID) | INTRAMUSCULAR | Status: DC
  Administered 2023-05-31 – 2023-06-01 (×3): 30 mg via INTRAVENOUS
  Filled 2023-05-31 (×3): qty 1

## 2023-05-31 MED ORDER — CEFAZOLIN SODIUM-DEXTROSE 2-4 GM/100ML-% IV SOLN
INTRAVENOUS | Status: AC
  Filled 2023-05-31: qty 100

## 2023-05-31 MED ORDER — LACTATED RINGERS IV SOLN
INTRAVENOUS | Status: DC
Start: 2023-05-31 — End: 2023-05-31

## 2023-05-31 MED ORDER — VASOPRESSIN 20 UNIT/ML IV SOLN
INTRAVENOUS | Status: DC | PRN
  Administered 2023-05-31: 24 mL via INTRAMUSCULAR

## 2023-05-31 MED ORDER — SODIUM CHLORIDE (PF) 0.9 % IJ SOLN
INTRAMUSCULAR | Status: AC
Start: 2023-05-31 — End: ?
  Filled 2023-05-31: qty 50

## 2023-05-31 MED ORDER — CEFAZOLIN SODIUM-DEXTROSE 2-4 GM/100ML-% IV SOLN
2.0000 g | Freq: Once | INTRAVENOUS | Status: AC
  Administered 2023-05-31: 2 g via INTRAVENOUS

## 2023-05-31 MED ORDER — DIPHENHYDRAMINE HCL 50 MG/ML IJ SOLN: 12.5000 mg | Freq: Four times a day (QID) | INTRAMUSCULAR | Status: DC | PRN

## 2023-05-31 MED ORDER — POVIDONE-IODINE 10 % EX SWAB
2.0000 | Freq: Once | CUTANEOUS | Status: AC
  Administered 2023-05-31: 2 via TOPICAL

## 2023-05-31 MED ORDER — TRANEXAMIC ACID-NACL 1000-0.7 MG/100ML-% IV SOLN
INTRAVENOUS | Status: AC
  Filled 2023-05-31: qty 100

## 2023-05-31 MED ORDER — ONDANSETRON HCL 4 MG/2ML IJ SOLN: 4.0000 mg | Freq: Four times a day (QID) | INTRAMUSCULAR | Status: DC | PRN

## 2023-05-31 MED ORDER — ORAL CARE MOUTH RINSE: 15.0000 mL | Freq: Once | OROMUCOSAL | Status: AC

## 2023-05-31 MED ORDER — KETAMINE HCL 50 MG/5ML IJ SOSY
PREFILLED_SYRINGE | INTRAMUSCULAR | Status: AC
  Filled 2023-05-31: qty 5

## 2023-05-31 MED ORDER — HYDROMORPHONE HCL 1 MG/ML IJ SOLN
INTRAMUSCULAR | Status: AC
  Filled 2023-05-31: qty 0.5

## 2023-05-31 MED ORDER — ROCURONIUM BROMIDE 10 MG/ML (PF) SYRINGE
PREFILLED_SYRINGE | INTRAVENOUS | Status: AC
Start: 2023-05-31 — End: ?
  Filled 2023-05-31: qty 10

## 2023-05-31 MED ORDER — ONDANSETRON HCL 4 MG/2ML IJ SOLN
INTRAMUSCULAR | Status: DC | PRN
  Administered 2023-05-31: 4 mg via INTRAVENOUS

## 2023-05-31 MED ORDER — ROCURONIUM BROMIDE 10 MG/ML (PF) SYRINGE
PREFILLED_SYRINGE | INTRAVENOUS | Status: DC | PRN
  Administered 2023-05-31: 20 mg via INTRAVENOUS
  Administered 2023-05-31 (×2): 10 mg via INTRAVENOUS
  Administered 2023-05-31: 60 mg via INTRAVENOUS

## 2023-05-31 MED ORDER — DEXAMETHASONE SODIUM PHOSPHATE 10 MG/ML IJ SOLN
INTRAMUSCULAR | Status: DC | PRN
  Administered 2023-05-31: 10 mg via INTRAVENOUS

## 2023-05-31 MED ORDER — LIDOCAINE 2% (20 MG/ML) 5 ML SYRINGE
INTRAMUSCULAR | Status: AC
Start: 2023-05-31 — End: ?
  Filled 2023-05-31: qty 5

## 2023-05-31 MED ORDER — LACTATED RINGERS IV SOLN: INTRAVENOUS | Status: AC

## 2023-05-31 MED ORDER — LIDOCAINE 2% (20 MG/ML) 5 ML SYRINGE
INTRAMUSCULAR | Status: DC | PRN
  Administered 2023-05-31: 40 mg via INTRAVENOUS

## 2023-05-31 MED ORDER — HYDROMORPHONE HCL 1 MG/ML IJ SOLN
0.2500 mg | INTRAMUSCULAR | Status: DC | PRN
  Administered 2023-05-31: 0.5 mg via INTRAVENOUS

## 2023-05-31 MED ORDER — 0.9 % SODIUM CHLORIDE (POUR BTL) OPTIME
TOPICAL | Status: DC | PRN
  Administered 2023-05-31: 1000 mL

## 2023-05-31 MED ORDER — PANTOPRAZOLE SODIUM 40 MG PO TBEC
40.0000 mg | DELAYED_RELEASE_TABLET | Freq: Every day | ORAL | Status: DC
Start: 2023-05-31 — End: 2023-06-02
  Administered 2023-06-01 – 2023-06-02 (×2): 40 mg via ORAL
  Filled 2023-05-31 (×2): qty 1

## 2023-05-31 MED ORDER — SENNOSIDES-DOCUSATE SODIUM 8.6-50 MG PO TABS: 1.0000 | ORAL_TABLET | Freq: Every evening | ORAL | Status: DC | PRN

## 2023-05-31 MED ORDER — DIPHENHYDRAMINE HCL 50 MG/ML IJ SOLN
12.5000 mg | Freq: Four times a day (QID) | INTRAMUSCULAR | Status: DC | PRN
Start: 2023-05-31 — End: 2023-06-01

## 2023-05-31 MED ORDER — MEPERIDINE HCL 25 MG/ML IJ SOLN: 6.2500 mg | INTRAMUSCULAR | Status: DC | PRN

## 2023-05-31 MED ORDER — DIPHENHYDRAMINE HCL 12.5 MG/5ML PO ELIX
12.5000 mg | ORAL_SOLUTION | Freq: Four times a day (QID) | ORAL | Status: DC | PRN
Start: 2023-05-31 — End: 2023-06-01

## 2023-05-31 MED ORDER — PROPOFOL 10 MG/ML IV BOLUS
INTRAVENOUS | Status: AC
  Filled 2023-05-31: qty 20

## 2023-05-31 MED ORDER — METHYLENE BLUE (ANTIDOTE) 1 % IV SOLN
INTRAVENOUS | Status: AC
Start: 2023-05-31 — End: ?
  Filled 2023-05-31: qty 10

## 2023-05-31 MED ORDER — MIDAZOLAM HCL 2 MG/2ML IJ SOLN
INTRAMUSCULAR | Status: AC
  Filled 2023-05-31: qty 2

## 2023-05-31 MED ORDER — HYDROMORPHONE 1 MG/ML IV SOLN
INTRAVENOUS | Status: DC
  Administered 2023-05-31: 30 mg via INTRAVENOUS
  Filled 2023-05-31: qty 30

## 2023-05-31 MED ORDER — ONDANSETRON HCL 4 MG/2ML IJ SOLN
INTRAMUSCULAR | Status: AC
Start: 2023-05-31 — End: ?
  Filled 2023-05-31: qty 2

## 2023-05-31 MED ORDER — VASOPRESSIN 20 UNIT/ML IV SOLN
INTRAVENOUS | Status: AC
  Filled 2023-05-31: qty 1

## 2023-05-31 MED ORDER — CHLORHEXIDINE GLUCONATE CLOTH 2 % EX PADS
6.0000 | MEDICATED_PAD | Freq: Every day | CUTANEOUS | Status: DC
  Administered 2023-06-01: 6 via TOPICAL

## 2023-05-31 MED ORDER — KETOROLAC TROMETHAMINE 30 MG/ML IJ SOLN
INTRAMUSCULAR | Status: AC
  Filled 2023-05-31: qty 1

## 2023-05-31 MED ORDER — VASOPRESSIN 20 UNIT/ML IV SOLN: INTRAVENOUS | Status: DC | PRN

## 2023-05-31 MED ORDER — CHLORHEXIDINE GLUCONATE 0.12 % MT SOLN: 15.0000 mL | Freq: Once | OROMUCOSAL | Status: AC

## 2023-05-31 MED ORDER — KETOROLAC TROMETHAMINE 30 MG/ML IJ SOLN
INTRAMUSCULAR | Status: DC | PRN
  Administered 2023-05-31: 30 mg via INTRAVENOUS

## 2023-05-31 MED ORDER — DIPHENHYDRAMINE HCL 12.5 MG/5ML PO ELIX: 12.5000 mg | ORAL_SOLUTION | Freq: Four times a day (QID) | ORAL | Status: DC | PRN

## 2023-05-31 MED ORDER — SODIUM CHLORIDE 0.9% FLUSH: 9.0000 mL | INTRAVENOUS | Status: DC | PRN

## 2023-05-31 MED ORDER — MENTHOL 3 MG MT LOZG: 1.0000 | LOZENGE | OROMUCOSAL | Status: DC | PRN

## 2023-05-31 MED ORDER — HYDROMORPHONE HCL 1 MG/ML IJ SOLN
INTRAMUSCULAR | Status: DC | PRN
  Administered 2023-05-31: .5 mg via INTRAVENOUS

## 2023-05-31 MED ORDER — KETAMINE HCL 50 MG/ML IJ SOLN
INTRAMUSCULAR | Status: DC | PRN
Start: 2023-05-31 — End: 2023-05-31
  Administered 2023-05-31: 10 mg via INTRAMUSCULAR
  Administered 2023-05-31: 20 mg via INTRAMUSCULAR

## 2023-05-31 MED ORDER — TRANEXAMIC ACID-NACL 1000-0.7 MG/100ML-% IV SOLN
1000.0000 mg | INTRAVENOUS | Status: AC
  Administered 2023-05-31: 1000 mg via INTRAVENOUS

## 2023-05-31 MED ORDER — MISOPROSTOL 200 MCG PO TABS
400.0000 ug | ORAL_TABLET | Freq: Once | ORAL | Status: AC
  Administered 2023-05-31: 400 ug via RECTAL
  Filled 2023-05-31: qty 2

## 2023-05-31 MED ORDER — PHENYLEPHRINE 80 MCG/ML (10ML) SYRINGE FOR IV PUSH (FOR BLOOD PRESSURE SUPPORT)
PREFILLED_SYRINGE | INTRAVENOUS | Status: AC
  Filled 2023-05-31: qty 10

## 2023-05-31 MED ORDER — SUGAMMADEX SODIUM 200 MG/2ML IV SOLN
INTRAVENOUS | Status: DC | PRN
  Administered 2023-05-31: 200 mg via INTRAVENOUS

## 2023-05-31 MED ORDER — SIMETHICONE 80 MG PO CHEW
80.0000 mg | CHEWABLE_TABLET | Freq: Four times a day (QID) | ORAL | Status: DC | PRN
  Administered 2023-06-01 – 2023-06-02 (×3): 80 mg via ORAL
  Filled 2023-05-31 (×3): qty 1

## 2023-05-31 MED ORDER — PROPOFOL 10 MG/ML IV BOLUS
INTRAVENOUS | Status: DC | PRN
  Administered 2023-05-31: 150 mg via INTRAVENOUS

## 2023-05-31 MED ORDER — BISACODYL 5 MG PO TBEC: 5.0000 mg | DELAYED_RELEASE_TABLET | Freq: Every day | ORAL | Status: DC | PRN

## 2023-05-31 MED ORDER — HYDROMORPHONE 1 MG/ML IV SOLN
INTRAVENOUS | Status: DC
  Administered 2023-05-31: 30 mg via INTRAVENOUS

## 2023-05-31 MED ORDER — NALOXONE HCL 0.4 MG/ML IJ SOLN: 0.4000 mg | INTRAMUSCULAR | Status: DC | PRN

## 2023-05-31 MED ORDER — FENTANYL CITRATE (PF) 250 MCG/5ML IJ SOLN
INTRAMUSCULAR | Status: DC | PRN
  Administered 2023-05-31: 50 ug via INTRAVENOUS
  Administered 2023-05-31: 25 ug via INTRAVENOUS
  Administered 2023-05-31: 150 ug via INTRAVENOUS
  Administered 2023-05-31: 25 ug via INTRAVENOUS

## 2023-05-31 MED ORDER — ALBUMIN HUMAN 5 % IV SOLN: INTRAVENOUS | Status: DC | PRN

## 2023-05-31 MED ORDER — DEXAMETHASONE SODIUM PHOSPHATE 10 MG/ML IJ SOLN
INTRAMUSCULAR | Status: AC
  Filled 2023-05-31: qty 1

## 2023-05-31 MED ORDER — CHLORHEXIDINE GLUCONATE 0.12 % MT SOLN
OROMUCOSAL | Status: AC
  Administered 2023-05-31: 15 mL via OROMUCOSAL
  Filled 2023-05-31: qty 15

## 2023-05-31 MED ORDER — OXYCODONE HCL 5 MG PO TABS: 5.0000 mg | ORAL_TABLET | Freq: Once | ORAL | Status: DC | PRN

## 2023-05-31 MED ORDER — IBUPROFEN 600 MG PO TABS: 600.0000 mg | ORAL_TABLET | Freq: Four times a day (QID) | ORAL | Status: DC

## 2023-05-31 MED ORDER — MIDAZOLAM HCL 2 MG/2ML IJ SOLN
INTRAMUSCULAR | Status: DC | PRN
  Administered 2023-05-31: 2 mg via INTRAVENOUS

## 2023-05-31 SURGICAL SUPPLY — 40 items
BARRIER ADHS 3X4 INTERCEED (GAUZE/BANDAGES/DRESSINGS) IMPLANT
CATH FOLEY 2WAY SLVR 5CC 16FR (CATHETERS) IMPLANT
CNTNR URN SCR LID CUP LEK RST (MISCELLANEOUS) IMPLANT
DRAIN PENROSE .5X12 LATEX STL (DRAIN) IMPLANT
DRAPE HYSTEROSCOPY (MISCELLANEOUS) IMPLANT
DRAPE WARM FLUID 44X44 (DRAPES) ×1 IMPLANT
DRSG OPSITE POSTOP 4X10 (GAUZE/BANDAGES/DRESSINGS) ×1 IMPLANT
DURAPREP 26ML APPLICATOR (WOUND CARE) ×1 IMPLANT
ELECT NDL TIP 2.8 STRL (NEEDLE) IMPLANT
ELECT NEEDLE TIP 2.8 STRL (NEEDLE) ×1 IMPLANT
GAUZE 4X4 16PLY ~~LOC~~+RFID DBL (SPONGE) ×1 IMPLANT
GLOVE BIOGEL PI IND STRL 7.0 (GLOVE) ×3 IMPLANT
GLOVE SURG SS PI 6.5 STRL IVOR (GLOVE) ×1 IMPLANT
GOWN STRL REUS W/ TWL LRG LVL3 (GOWN DISPOSABLE) ×3 IMPLANT
HANDLE YANKAUER SUCT OPEN TIP (MISCELLANEOUS) IMPLANT
HEMOSTAT ARISTA ABSORB 3G PWDR (HEMOSTASIS) IMPLANT
KIT TURNOVER KIT B (KITS) ×1 IMPLANT
MANIFOLD NEPTUNE II (INSTRUMENTS) ×1 IMPLANT
NDL HYPO 22X1.5 SAFETY MO (MISCELLANEOUS) ×1 IMPLANT
NEEDLE HYPO 22X1.5 SAFETY MO (MISCELLANEOUS) ×1 IMPLANT
NS IRRIG 1000ML POUR BTL (IV SOLUTION) ×1 IMPLANT
PACK ABDOMINAL GYN (CUSTOM PROCEDURE TRAY) ×1 IMPLANT
PAD ARMBOARD 7.5X6 YLW CONV (MISCELLANEOUS) ×1 IMPLANT
PAD OB MATERNITY 4.3X12.25 (PERSONAL CARE ITEMS) ×1 IMPLANT
RTRCTR C-SECT PINK 25CM LRG (MISCELLANEOUS) IMPLANT
SET CYSTO W/LG BORE CLAMP LF (SET/KITS/TRAYS/PACK) ×1 IMPLANT
SHEET LAVH (DRAPES) IMPLANT
SPONGE T-LAP 18X18 ~~LOC~~+RFID (SPONGE) IMPLANT
SURGIFLO W/THROMBIN 8M KIT (HEMOSTASIS) IMPLANT
SUT CHROMIC 0 CT 1 (SUTURE) IMPLANT
SUT MON AB 4-0 PS1 27 (SUTURE) ×1 IMPLANT
SUT PLAIN 2 0 XLH (SUTURE) IMPLANT
SUT VIC AB 0 CT1 27XBRD ANBCTR (SUTURE) ×4 IMPLANT
SUT VIC AB 0 CT1 36 (SUTURE) ×1 IMPLANT
SUT VIC AB 3-0 SH 27X BRD (SUTURE) ×4 IMPLANT
SUT VIC AB 4-0 KS 27 (SUTURE) IMPLANT
SYR 20ML LL LF (SYRINGE) IMPLANT
SYR CONTROL 10ML LL (SYRINGE) ×1 IMPLANT
TOWEL GREEN STERILE FF (TOWEL DISPOSABLE) ×2 IMPLANT
TRAY FOLEY W/BAG SLVR 14FR (SET/KITS/TRAYS/PACK) ×1 IMPLANT

## 2023-05-31 NOTE — Transfer of Care (Signed)
Immediate Anesthesia Transfer of Care Note  Patient: Lisa Herring  Procedure(s) Performed: ABDOMINAL MYOMECTOMY (Abdomen)  Patient Location: PACU  Anesthesia Type:General  Level of Consciousness: awake, alert , and oriented  Airway & Oxygen Therapy: Patient Spontanous Breathing and Patient connected to face mask oxygen  Post-op Assessment: Report given to RN and Post -op Vital signs reviewed and stable  Post vital signs: Reviewed and stable  Last Vitals:  Vitals Value Taken Time  BP 150/84 05/31/23 1431  Temp    Pulse 98 05/31/23 1435  Resp 16 05/31/23 1435  SpO2 100 % 05/31/23 1435  Vitals shown include unfiled device data.  Last Pain:  Vitals:   05/31/23 1025  TempSrc: Oral  PainSc:          Complications: No notable events documented.

## 2023-05-31 NOTE — Anesthesia Procedure Notes (Addendum)
Procedure Name: Intubation Date/Time: 05/31/2023 11:50 AM  Performed by: Thomasene Ripple, CRNAPre-anesthesia Checklist: Patient identified, Emergency Drugs available, Suction available and Patient being monitored Patient Re-evaluated:Patient Re-evaluated prior to induction Oxygen Delivery Method: Circle System Utilized Preoxygenation: Pre-oxygenation with 100% oxygen Induction Type: IV induction Ventilation: Mask ventilation without difficulty Laryngoscope Size: Miller and 2 Grade View: Grade I Tube type: Oral Tube size: 7.0 mm Number of attempts: 1 Airway Equipment and Method: Stylet and Oral airway Placement Confirmation: ETT inserted through vocal cords under direct vision, positive ETCO2 and breath sounds checked- equal and bilateral Secured at: 22 cm Tube secured with: Tape Dental Injury: Teeth and Oropharynx as per pre-operative assessment  Comments: Performed by Adrienne Mocha

## 2023-05-31 NOTE — Anesthesia Postprocedure Evaluation (Signed)
Anesthesia Post Note  Patient: Lisa Herring  Procedure(s) Performed: ABDOMINAL MYOMECTOMY (Abdomen)     Patient location during evaluation: PACU Anesthesia Type: General Level of consciousness: awake and alert, oriented and patient cooperative Pain management: pain level controlled Vital Signs Assessment: post-procedure vital signs reviewed and stable Respiratory status: spontaneous breathing, nonlabored ventilation and respiratory function stable Cardiovascular status: blood pressure returned to baseline and stable Postop Assessment: no apparent nausea or vomiting Anesthetic complications: no   No notable events documented.  Last Vitals:  Vitals:   05/31/23 1515 05/31/23 1530  BP: 130/80 131/74  Pulse: 85 84  Resp: 16 15  Temp: 36.7 C 36.9 C  SpO2: 96% 99%    Last Pain:  Vitals:   05/31/23 1530  TempSrc: Oral  PainSc:                  Lyndal Reggio,E. Avamae Dehaan

## 2023-05-31 NOTE — Interval H&P Note (Signed)
History and Physical Interval Note:  05/31/2023 9:23 AM  Lisa Herring  has presented today for surgery, with the diagnosis of Uterine leiomyoma.  The various methods of treatment have been discussed with the patient and family. After consideration of risks, benefits and other options for treatment, the patient has consented to  Procedure(s): ABDOMINAL MYOMECTOMY (N/A) possible hysterectomy as a surgical intervention.  The patient's history has been reviewed, patient examined, no change in status, stable for surgery.  I have reviewed the patient's chart and labs.  Questions were answered to the patient's satisfaction.     Prescilla Sours, MD.

## 2023-05-31 NOTE — Plan of Care (Signed)
  Problem: Education: Goal: Knowledge of General Education information will improve Description: Including pain rating scale, medication(s)/side effects and non-pharmacologic comfort measures Outcome: Progressing   Problem: Health Behavior/Discharge Planning: Goal: Ability to manage health-related needs will improve Outcome: Progressing   Problem: Clinical Measurements: Goal: Ability to maintain clinical measurements within normal limits will improve Outcome: Progressing Goal: Will remain free from infection Outcome: Progressing Goal: Diagnostic test results will improve Outcome: Progressing Goal: Respiratory complications will improve Outcome: Progressing Goal: Cardiovascular complication will be avoided Outcome: Progressing   Problem: Activity: Goal: Risk for activity intolerance will decrease Outcome: Progressing   Problem: Nutrition: Goal: Adequate nutrition will be maintained Outcome: Progressing   Problem: Coping: Goal: Level of anxiety will decrease Outcome: Progressing   Problem: Elimination: Goal: Will not experience complications related to bowel motility Outcome: Progressing Goal: Will not experience complications related to urinary retention Outcome: Progressing   Problem: Pain Management: Goal: General experience of comfort will improve Outcome: Progressing   Problem: Safety: Goal: Ability to remain free from injury will improve Outcome: Progressing   Problem: Skin Integrity: Goal: Risk for impaired skin integrity will decrease Outcome: Progressing   Problem: Education: Goal: Knowledge of the prescribed therapeutic regimen will improve Outcome: Progressing Goal: Understanding of sexual limitations or changes related to disease process or condition will improve Outcome: Progressing Goal: Individualized Educational Video(s) Outcome: Progressing   Problem: Self-Concept: Goal: Communication of feelings regarding changes in body function or  appearance will improve Outcome: Progressing   Problem: Skin Integrity: Goal: Demonstration of wound healing without infection will improve Outcome: Progressing   Problem: Education: Goal: Knowledge of the prescribed therapeutic regimen will improve Outcome: Progressing Goal: Understanding of sexual limitations or changes related to disease process or condition will improve Outcome: Progressing Goal: Individualized Educational Video(s) Outcome: Progressing   Problem: Self-Concept: Goal: Communication of feelings regarding changes in body function or appearance will improve Outcome: Progressing   Problem: Skin Integrity: Goal: Demonstration of wound healing without infection will improve Outcome: Progressing

## 2023-06-01 ENCOUNTER — Encounter (HOSPITAL_COMMUNITY): Payer: Self-pay | Admitting: Obstetrics & Gynecology

## 2023-06-01 LAB — CBC
HCT: 28.3 % — ABNORMAL LOW (ref 36.0–46.0)
Hemoglobin: 9.6 g/dL — ABNORMAL LOW (ref 12.0–15.0)
MCH: 29.8 pg (ref 26.0–34.0)
MCHC: 33.9 g/dL (ref 30.0–36.0)
MCV: 87.9 fL (ref 80.0–100.0)
Platelets: 208 10*3/uL (ref 150–400)
RBC: 3.22 MIL/uL — ABNORMAL LOW (ref 3.87–5.11)
RDW: 13.1 % (ref 11.5–15.5)
WBC: 11.7 10*3/uL — ABNORMAL HIGH (ref 4.0–10.5)
nRBC: 0 % (ref 0.0–0.2)

## 2023-06-01 LAB — BASIC METABOLIC PANEL
Anion gap: 8 (ref 5–15)
BUN: 12 mg/dL (ref 6–20)
CO2: 25 mmol/L (ref 22–32)
Calcium: 8.6 mg/dL — ABNORMAL LOW (ref 8.9–10.3)
Chloride: 101 mmol/L (ref 98–111)
Creatinine, Ser: 1.4 mg/dL — ABNORMAL HIGH (ref 0.44–1.00)
GFR, Estimated: 51 mL/min — ABNORMAL LOW (ref >60)
Glucose, Bld: 110 mg/dL — ABNORMAL HIGH (ref 70–99)
Potassium: 4 mmol/L (ref 3.5–5.1)
Sodium: 134 mmol/L — ABNORMAL LOW (ref 135–145)

## 2023-06-01 MED ORDER — OXYCODONE-ACETAMINOPHEN 5-325 MG PO TABS
1.0000 | ORAL_TABLET | ORAL | Status: DC | PRN
  Administered 2023-06-01: 2 via ORAL
  Administered 2023-06-01 – 2023-06-02 (×2): 1 via ORAL
  Filled 2023-06-01 (×4): qty 1

## 2023-06-01 NOTE — Plan of Care (Signed)
  Problem: Pain Management: Goal: General experience of comfort will improve Outcome: Progressing   Problem: Safety: Goal: Ability to remain free from injury will improve Outcome: Progressing

## 2023-06-01 NOTE — Progress Notes (Incomplete Revision)
   06/01/23 1122  Mobility  Activity Ambulated independently in hallway  Level of Assistance Independent after set-up  Assistive Device Other (Comment) (IV Pole)  Distance Ambulated (ft) 550 ft  Activity Response Tolerated fair  Mobility Referral Yes  Mobility Specialist Start Time (ACUTE ONLY) 1051  Mobility Specialist Stop Time (ACUTE ONLY) 1122  Mobility Specialist Time Calculation (min) (ACUTE ONLY) 31 min   Mobility Specialist: Progress Note  During Mobility: HR 110-120, SpO2 97% RA  Pt agreeable to mobility session - received in bed. Required Ind using RW. C/o abd pain when standing and gas pain. Returned to chair with all needs met - call bell within reach.   Barnie Mort, BS Mobility Specialist Please contact via SecureChat or Rehab office at [phone].

## 2023-06-01 NOTE — Progress Notes (Signed)
   06/01/23 1122  Mobility  Activity Ambulated independently in hallway  Level of Assistance Independent after set-up  Assistive Device Other (Comment) (IV Pole)  Distance Ambulated (ft) 550 ft  Activity Response Tolerated fair  Mobility Referral Yes  $Mobility charge 1 Mobility  Mobility Specialist Start Time (ACUTE ONLY) 1051  Mobility Specialist Stop Time (ACUTE ONLY) 1122  Mobility Specialist Time Calculation (min) (ACUTE ONLY) 31 min   Mobility Specialist: Progress Note   During Mobility: HR 110-120, SpO2 97% RA   Pt agreeable to mobility session - received in bed. Required Ind using RW. C/o abd pain when standing and gas pain. Returned to chair with all needs met - call bell within reach.    Barnie Mort, BS Mobility Specialist Please contact via SecureChat or Rehab office at [phone].

## 2023-06-01 NOTE — Progress Notes (Signed)
This RN stopped the PCA pump per MD order. Wasted 27ml of dilaudid with Psychologist, clinical.

## 2023-06-01 NOTE — Progress Notes (Signed)
   06/01/23 1122  Mobility  Activity Ambulated independently in hallway  Level of Assistance Independent after set-up  Assistive Device Other (Comment) (IV Pole)  Distance Ambulated (ft) 550 ft  Activity Response Tolerated fair  Mobility Referral Yes  Mobility Specialist Start Time (ACUTE ONLY) 1051  Mobility Specialist Stop Time (ACUTE ONLY) 1122  Mobility Specialist Time Calculation (min) (ACUTE ONLY) 31 min   Mobility Specialist: Progress Note  During Mobility: HR 110-120, SpO2 97% RA  Pt agreeable to mobility session - received in bed. Required Ind using RW. C/o abd pain when standing and gas pain. Returned to chair with all needs met - call bell within reach.   Barnie Mort, BS Mobility Specialist Please contact via SecureChat or Rehab office at [phone].

## 2023-06-01 NOTE — Progress Notes (Signed)
1 Day Post-Op Procedure(s) (LRB): ABDOMINAL MYOMECTOMY (N/A)  Subjective: Patient reports she is tolerating liquid oral intake, no nausea or vomiting. Her vaginal bleeding has decreased. Her pain is well controlled with PCA pump. She has not ambulated yet. With foley catheter in place, urine appears more blood tinged today.   Objective:    06/01/2023   10:01 AM 06/01/2023    4:15 AM 06/01/2023   12:25 AM  Vitals with BMI  Systolic 120 114 161  Diastolic 72 71 75  Pulse 87 78 85    I/O last 3 completed shifts: In: 2819.3 [P.O.:240; I.V.:1879.3; IV Piggyback:700] Out: 2240 [Urine:1450; Blood:790]  Total I/O In: 240 [P.O.:240] Out: 500 [Urine:500]   General: alert, cooperative, and no distress Resp: with normal breathing efforts.  Cardio: regular rate and rhythm GI: soft, appropriately tender to palpation near incision, normal bowel sounds are present.  Extremities: extremities normal, atraumatic, no cyanosis or edema Vaginal Bleeding: minimal  CBC    Component Value Date/Time   WBC 11.7 (H) 06/01/2023 0640   RBC 3.22 (L) 06/01/2023 0640   HGB 9.6 (L) 06/01/2023 0640   HCT 28.3 (L) 06/01/2023 0640   PLT 208 06/01/2023 0640   MCV 87.9 06/01/2023 0640   MCH 29.8 06/01/2023 0640   MCHC 33.9 06/01/2023 0640   RDW 13.1 06/01/2023 0640   LYMPHSABS 2.4 05/18/2023 0758   MONOABS 0.4 05/18/2023 0758   EOSABS 0.1 05/18/2023 0758   BASOSABS 0.0 05/18/2023 0758      Latest Ref Rng & Units 06/01/2023    6:40 AM 05/31/2023   10:40 AM 05/19/2023    6:45 PM  BMP  Glucose 70 - 99 mg/dL 096  99  99   BUN 6 - 20 mg/dL 12  8  18    Creatinine 0.44 - 1.00 mg/dL 0.45  4.09  8.11   Sodium 135 - 145 mmol/L 134  136  136   Potassium 3.5 - 5.1 mmol/L 4.0  4.5  4.1   Chloride 98 - 111 mmol/L 101  104  103   CO2 22 - 32 mmol/L 25  24  23    Calcium 8.9 - 10.3 mg/dL 8.6  9.5  9.3     Assessment: s/p Procedure(s): ABDOMINAL MYOMECTOMY (N/A): stable  Plan: Advance diet to regular diet.   Patient was encouraged to increase hydration.  Encourage ambulation. Advance to PO medication. Will increase IV fluids to help clear urine and then will d/c foley catheter in a few hours.  Discussed with patient intra-operative findings, cesarean delivery recommendation in case of future pregnancy.  Recheck BMP in the AM.   LOS: 1 day    Prescilla Sours, MD 06/01/2023, 12:58 PM

## 2023-06-01 NOTE — Progress Notes (Signed)
RN changed patient's pad. Per patient, pad was from 0230 this am and had small-moderate drainage on the pad. Will continue to monitor.Incision site is clean, dry, and intact, with a small amount of drainage outlined with sharpie.

## 2023-06-01 NOTE — Discharge Instructions (Signed)
  Jissel, 1. While at home remember to walk regularly, at least half an hour to1 hour a day in addition to your activities of daily living, as this will help with your quick recovery. 2. Do not do any heavy lifting, i.e nothing heavier than 15 lbs for the next 6 weeks 3.  Do not use tampons or douche or take baths, do not have any sexual intercourse or anything inside the vagina for the next 6 weeks.  4. Take your pain medication as needed for pain, let me know if the pain is not well controlled despite pain medication use.  5. Get plenty of rest especially for the next two weeks to allow your body to recover.  You may feel tired in the process, this is normal. 6. You may take a stool softener e.g colace if you are constipated.    7.  If you get a fever while at home, check your temperature and if it is equal to or greater than 100.4 you may take tylenol.  If you continue with the fever after the tylenol please call the office.   8.  You may shower as usual even while you have the honey comb dressing and ensure to pat the dressing dry.  Do not put soap directly over the dressing or rub it.    9.  Remove the Honey comb dressing one week from your surgery date. To remove the honey comb dressing, first take a shower.  After showering pat the dressing dry then peel it off gently from the corners.  After honey comb comes out there will be steri strips left on the incision.  You may continue showering as usual and the steri strips may get wet.  After showering pat the steri strips dry. Allow the steri strips to fall off by themselves over time.  Do not rub the incision directly or put soap directly over the incision.  Always rinse off any soap over the incision and pat the incision dry.  10.  Some vaginal bleeding is expected and normal after your surgery. Please let me know if if it excessive where you saturate 1 pad in less than 2 hours or so.  11. Please take an iron tablet orally once a day to help with  anemia. If the iron tablet causes constipation please take an over the counter stool softener to help with this.   I wish you a quick and safe recovery.  Dr. [facility] [phone], office extension [phone].

## 2023-06-02 LAB — BASIC METABOLIC PANEL
Anion gap: 4 — ABNORMAL LOW (ref 5–15)
BUN: 8 mg/dL (ref 6–20)
CO2: 27 mmol/L (ref 22–32)
Calcium: 8.4 mg/dL — ABNORMAL LOW (ref 8.9–10.3)
Chloride: 106 mmol/L (ref 98–111)
Creatinine, Ser: 1.03 mg/dL — ABNORMAL HIGH (ref 0.44–1.00)
GFR, Estimated: >60 mL/min (ref >60)
Glucose, Bld: 105 mg/dL — ABNORMAL HIGH (ref 70–99)
Potassium: 3.6 mmol/L (ref 3.5–5.1)
Sodium: 137 mmol/L (ref 135–145)

## 2023-06-02 LAB — SURGICAL PATHOLOGY

## 2023-06-02 MED ORDER — OXYCODONE-ACETAMINOPHEN 5-325 MG PO TABS: 1.0000 | ORAL_TABLET | ORAL | 0 refills | Status: DC | PRN

## 2023-06-02 MED ORDER — SIMETHICONE 80 MG PO CHEW: 80.0000 mg | CHEWABLE_TABLET | Freq: Four times a day (QID) | ORAL | 0 refills | Status: DC | PRN

## 2023-06-02 NOTE — Discharge Summary (Signed)
Physician Discharge Summary  Patient ID: Lisa Herring MRN: [id number] DOB/AGE: 02/02/1991 32 y.o.  Admit date: 05/31/2023 Discharge date: 06/02/2023  Admission Diagnoses: Intramural Leiomyoma. Pelvic pain. Right hydronephrosis s/p right ureteral stent placement.   Discharge Diagnoses:  Principal Problem:   Intramural leiomyoma of uterus Active Problems:   S/P myomectomy  Discharged Condition: good  Hospital Course: 32 y/o female who presented for abdominal myomectomy for symptomatic fibroids.  Her surgery was done without complications as per the operative note.  Post-operatively she did well and by POD # 2 she was tolerating a regular diet, passing flatus, ambulating and voiding without difficulty.  She was deemed stable for discharge.  She had an upcoming appointment with Urology on 12/12/2 for right ureter stent removal.    Consults: None  Significant Diagnostic Studies: labs:     Latest Ref Rng & Units 06/01/2023    6:40 AM 05/19/2023    6:45 PM 05/18/2023    7:58 AM  CBC  WBC 4.0 - 10.5 K/uL 11.7  5.1  5.8   Hemoglobin 12.0 - 15.0 g/dL 9.6  62.9  52.8   Hematocrit 36.0 - 46.0 % 28.3  40.9  36.3   Platelets 150 - 400 K/uL 208  244  199        Latest Ref Rng & Units 06/02/2023    6:15 AM 06/01/2023    6:40 AM 05/31/2023   10:40 AM  CMP  Glucose 70 - 99 mg/dL 413  244  99   BUN 6 - 20 mg/dL 8  12  8    Creatinine 0.44 - 1.00 mg/dL 0.10  2.72  5.36   Sodium 135 - 145 mmol/L 137  134  136   Potassium 3.5 - 5.1 mmol/L 3.6  4.0  4.5   Chloride 98 - 111 mmol/L 106  101  104   CO2 22 - 32 mmol/L 27  25  24    Calcium 8.9 - 10.3 mg/dL 8.4  8.6  9.5   Total Protein 6.5 - 8.1 g/dL   6.8   Total Bilirubin <1.2 mg/dL   0.9   Alkaline Phos 38 - 126 U/L   55   AST 15 - 41 U/L   34   ALT 0 - 44 U/L   35     Discharge Exam: Blood pressure (!) 112/59, pulse 98, temperature 99.5 F (37.5 C), temperature source Oral, resp. rate 18, height 5\' 9"  (1.753 m), weight 90.7 kg, last  menstrual period 05/08/2023, SpO2 97%. General appearance: alert, cooperative, and no distress Resp: normal breathing movement.  Cardio: regular rate and rythm GI: soft, appropriately tender to palpation around incision Pelvic: Deferred Extremities: extremities normal, atraumatic, no cyanosis or edema Incision: With honey comb dressing, with three small areas with dried blood, otherwise dry and intact.   Disposition: Discharge disposition: 01-Home or Self Care      Allergies as of 06/02/2023   No Known Allergies      Medication List     STOP taking these medications    acetaminophen 500 MG tablet Commonly known as: TYLENOL   ibuprofen 800 MG tablet Commonly known as: ADVIL   oxyCODONE 5 MG immediate release tablet Commonly known as: Oxy IR/ROXICODONE       TAKE these medications    bisacodyl 5 MG EC tablet Commonly known as: Dulcolax Take 1 tablet (5 mg total) by mouth daily as needed for moderate constipation.   GOODY HEADACHE PO Take 1 Package by mouth daily  as needed (headache, pain).   oxyCODONE-acetaminophen 5-325 MG tablet Commonly known as: PERCOCET/ROXICET Take 1-2 tablets by mouth every 4 (four) hours as needed for moderate pain (pain score 4-6) or severe pain (pain score 7-10).   polyethylene glycol 17 g packet Commonly known as: MIRALAX / GLYCOLAX Take 17 g by mouth daily as needed.   senna-docusate 8.6-50 MG tablet Commonly known as: Senokot-S Take 2 tablets by mouth at bedtime as needed for mild constipation.   simethicone 80 MG chewable tablet Commonly known as: MYLICON Chew 1 tablet (80 mg total) by mouth 4 (four) times daily as needed for flatulence.      Iron polysaccharide 150 mg tablet: 1 tab every other day.    Follow-up Information     Hoover Browns, MD. Go to.   Specialty: Obstetrics and Gynecology Why: 06/14/23 at 3 pm.  07/13/23 at 1 pm Contact information: [address] [place] [address] [phone]                 Signed: Prescilla Sours, MD. 06/02/2023, 9:30 PM

## 2023-06-10 ENCOUNTER — Telehealth: Payer: Self-pay | Admitting: Urology

## 2023-06-10 ENCOUNTER — Ambulatory Visit (INDEPENDENT_AMBULATORY_CARE_PROVIDER_SITE_OTHER): Payer: 59 | Admitting: Urology

## 2023-06-10 VITALS — BP 131/83 | HR 108

## 2023-06-10 DIAGNOSIS — R8271 Bacteriuria: Secondary | ICD-10-CM | POA: Diagnosis not present

## 2023-06-10 DIAGNOSIS — R8281 Pyuria: Secondary | ICD-10-CM

## 2023-06-10 DIAGNOSIS — N3001 Acute cystitis with hematuria: Secondary | ICD-10-CM

## 2023-06-10 DIAGNOSIS — N1330 Unspecified hydronephrosis: Secondary | ICD-10-CM

## 2023-06-10 DIAGNOSIS — R3129 Other microscopic hematuria: Secondary | ICD-10-CM

## 2023-06-10 MED ORDER — CEFDINIR 300 MG PO CAPS: 300.0000 mg | ORAL_CAPSULE | Freq: Two times a day (BID) | ORAL | 0 refills | Status: DC

## 2023-06-10 MED ORDER — CEFTRIAXONE SODIUM 1 G IJ SOLR
1.0000 g | Freq: Once | INTRAMUSCULAR | Status: AC
Start: 2023-06-10 — End: 2023-06-10
  Administered 2023-06-10: 1 g via INTRAMUSCULAR

## 2023-06-10 NOTE — Telephone Encounter (Signed)
Pt is unable to make the 12/16 stent appt at 4pm and needs another date. I told her I would let you know so you could talk to dr hall again about sch another date

## 2023-06-10 NOTE — Progress Notes (Signed)
   Assessment: 1. Hydronephrosis, unspecified hydronephrosis type   2. Acute cystitis with hematuria     Plan: Given the apparent UTI, we will begin antibiotic therapy and set up her for stent removal in a few days. Urine sent for culture Patient given Rocephin 1 g IM in the office today Rx: Omnicef 300 twice daily for 5 days  Chief Complaint: Here for stent removal  HPI: Lisa Herring is a 32 y.o. female who presents for planned cystoscopy and stent removal. See my note 05/16/2023 to time of initial consultation in the hospital.  Briefly the patient had large uterine fibroids with resulting right-sided hydro and flank pain.  Patient underwent placement of a right double-J stent on 05/16/2023. 10 days ago, she underwent myomectomy.    Patient is doing quite well postop.  She does have some left-sided flank discomfort as well as bladder pressure.  Urinalysis shows significant microscopic hematuria pyuria as well as bacteria.  Portions of the above documentation were copied from a prior visit for review purposes only.  Allergies: No Known Allergies  PMH: Past Medical History:  Diagnosis Date   Uterine fibroid     PSH: Past Surgical History:  Procedure Laterality Date   CYSTOSCOPY W/ URETERAL STENT PLACEMENT Right 05/16/2023   Procedure: CYSTOSCOPY WITH RETROGRADE PYELOGRAM/URETERAL STENT PLACEMENT;  Surgeon: Joline Maxcy, MD;  Location: [facility] OR;  Service: Urology;  Laterality: Right;   MYOMECTOMY N/A 05/31/2023   Procedure: ABDOMINAL MYOMECTOMY;  Surgeon: Hoover Browns, MD;  Location: [facility] OR;  Service: Gynecology;  Laterality: N/A;   WISDOM TOOTH EXTRACTION      SH: Social History   Tobacco Use   Smoking status: Never   Smokeless tobacco: Never  Vaping Use   Vaping status: Never Used  Substance Use Topics   Alcohol use: Yes    Comment: socially   Drug use: No    ROS: Constitutional:  Negative for fever, chills, weight loss CV: Negative for chest pain,  previous MI, hypertension Respiratory:  Negative for shortness of breath, wheezing, sleep apnea, frequent cough GI:  Negative for nausea, vomiting, bloody stool, GERD  PE: BP 131/83   Pulse (!) 108   LMP 05/08/2023 Comment: DOS UPREG NEGATIVE GENERAL APPEARANCE:  Well appearing, well developed, well nourished, NAD    Results: Urinalysis is worrisome for possible UTI.

## 2023-06-10 NOTE — Progress Notes (Signed)
IM Injection  Patient is present today for an IM Injection for treatment of uti Drug: CEFTRIAXONE  Dose:1GM Location:RIGHT GLUTE Lot: [id number] Exp:12/2024 Patient tolerated well, no complications were noted  Performed by: Cabe Lashley N., CMA(AAMA)

## 2023-06-11 LAB — URINALYSIS, ROUTINE W REFLEX MICROSCOPIC
Bilirubin, UA: NEGATIVE
Glucose, UA: NEGATIVE
Ketones, UA: NEGATIVE
Nitrite, UA: NEGATIVE
Specific Gravity, UA: >1.03 — ABNORMAL HIGH (ref 1.005–1.030)
Urobilinogen, Ur: 0.2 mg/dL (ref 0.2–1.0)
pH, UA: 6 (ref 5.0–7.5)

## 2023-06-11 LAB — MICROSCOPIC EXAMINATION: RBC, Urine: >30 /[HPF] — ABNORMAL HIGH (ref 0–2)

## 2023-06-11 LAB — URINE CULTURE

## 2023-06-14 ENCOUNTER — Encounter: Payer: 59 | Admitting: Urology

## 2023-06-14 ENCOUNTER — Telehealth: Payer: Self-pay | Admitting: Urology

## 2023-06-14 NOTE — Telephone Encounter (Signed)
Pt called to ask if she needed to reschedule having her stent removed since she started her monthly cycle.

## 2023-06-14 NOTE — Telephone Encounter (Signed)
Per Dr. Margo Aye, ok to proceed.   Pt aware to keep appt for tomorrow morning for stent removal.

## 2023-06-14 NOTE — Telephone Encounter (Signed)
Has questions regarding appt tomorrow.

## 2023-06-15 ENCOUNTER — Ambulatory Visit (INDEPENDENT_AMBULATORY_CARE_PROVIDER_SITE_OTHER): Payer: 59 | Admitting: Urology

## 2023-06-15 VITALS — BP 122/75 | HR 101

## 2023-06-15 DIAGNOSIS — Z466 Encounter for fitting and adjustment of urinary device: Secondary | ICD-10-CM

## 2023-06-15 DIAGNOSIS — N1330 Unspecified hydronephrosis: Secondary | ICD-10-CM

## 2023-06-15 LAB — URINALYSIS, ROUTINE W REFLEX MICROSCOPIC
Bilirubin, UA: NEGATIVE
Glucose, UA: NEGATIVE
Ketones, UA: NEGATIVE
Nitrite, UA: NEGATIVE
Specific Gravity, UA: >1.03 — ABNORMAL HIGH (ref 1.005–1.030)
Urobilinogen, Ur: 0.2 mg/dL (ref 0.2–1.0)
pH, UA: 6 (ref 5.0–7.5)

## 2023-06-15 LAB — MICROSCOPIC EXAMINATION: RBC, Urine: >30 /[HPF] — AB (ref 0–2)

## 2023-06-15 NOTE — Progress Notes (Signed)
   Assessment: 1. Hydronephrosis, unspecified hydronephrosis type     Plan: FU 4-6 weeks with renal US prior to visit.  Chief Complaint: Here for stent removal  HPI: Lisa Herring is a 32 y.o. female who presents for cystoscopy and stent removal. Last month she had a right double-J stent placed due to ureteral obstruction and flank pain as a result of uterine compression.  She has now had her GYN surgery and presents here today for stent removal.   Portions of the above documentation were copied from a prior visit for review purposes only.  Allergies: No Known Allergies  PMH: Past Medical History:  Diagnosis Date   Uterine fibroid     PSH: Past Surgical History:  Procedure Laterality Date   CYSTOSCOPY W/ URETERAL STENT PLACEMENT Right 05/16/2023   Procedure: CYSTOSCOPY WITH RETROGRADE PYELOGRAM/URETERAL STENT PLACEMENT;  Surgeon: Joline Maxcy, MD;  Location: [facility] OR;  Service: Urology;  Laterality: Right;   MYOMECTOMY N/A 05/31/2023   Procedure: ABDOMINAL MYOMECTOMY;  Surgeon: Hoover Browns, MD;  Location: [facility] OR;  Service: Gynecology;  Laterality: N/A;   WISDOM TOOTH EXTRACTION      SH: Social History   Tobacco Use   Smoking status: Never   Smokeless tobacco: Never  Vaping Use   Vaping status: Never Used  Substance Use Topics   Alcohol use: Yes    Comment: socially   Drug use: No    ROS: Constitutional:  Negative for fever, chills, weight loss CV: Negative for chest pain, previous MI, hypertension Respiratory:  Negative for shortness of breath, wheezing, sleep apnea, frequent cough GI:  Negative for nausea, vomiting, bloody stool, GERD  PE: LMP 05/08/2023 Comment: DOS UPREG NEGATIVE GENERAL APPEARANCE:  Well appearing, well developed, well nourished, NAD    PROCEDURE:  CYSTO AND STENT REMOVAL  INDICATION:  URETERAL OBSTRUCTION  DESCRIPTION OF PROCEDURE: The patient was brought to the procedure room where she was correctly identified and the  procedure again reviewed with her.  Informed consent was obtained.  The patient was subsequently positioned in the modified dorsolithotomy position and her external genitalia were prepped in the usual fashion.  Preprocedural timeout was performed.    Flexible cystoscopy was then performed.  The right double-J stent was seen coming from the right ureteral opening and was grasped with a flexible graspers and removed intact without difficulty.  The procedure was well-tolerated.  No complications.

## 2023-06-15 NOTE — Addendum Note (Signed)
Addended by: Malva Cogan L on: 06/15/2023 11:00 AM   Modules accepted: Orders

## 2023-06-21 NOTE — Progress Notes (Signed)
 Internal Medicine at [facility]   ASSESSMENT/PLAN:  1. Decreased GFR (Primary): -New; she has had GFR decreased into the 50s; associated w/ hx of large uterine fibroids creating obstruction of ureter and associated hydronephrosis. Now s/p myomectomy and ureteral stenting. -I expect her renal studies to improve/normalize; repeat labs as below. - Albumin , Random Urine; Future - Comprehensive Metabolic Panel; Future - TSH; Future  2. Postoperative anemia: -New; moderate anemia w/ hemoglobin 9.6 following her abdominal myomectomy surgery earlier this month. -I will repeat her labs today to ensure resolution of her anemia over time. - CBC with Differential; Future  3. Hypomagnesemia: -New; slightly low magnesium (1.7). -Pending recheck. Supplement if needed. - Magnesium; Future  4. Low serum calcium: -New; slightly low calcium (8.4). Pending recheck; check vit d as well. - Vitamin D, 25-Hydroxy; Future  5. Encounter to establish care: -New patient to establish care. -Check labs. -Obtain most recent report for cervical cancer screening from GYN. -Screen hep c. -Continue regular eye/dental exams. -Mammogram at 40. -Colonoscopy at 45.  6. Screening for diabetes mellitus: - Hemoglobin A1C With Estimated Average Glucose; Future  7. Need for hepatitis C screening test: - Hepatitis C Virus (HCV) Antibody Screen With Confirmation; Future  Plan: Patient expresses understanding of their current medications and use.  If a new prescription was given today, then I discussed potential side effects, drug interactions, instructions for taking the medication, and the consequences of not taking it. Patient verbalized an understanding of these instructions. Patient is able to verbalize understanding of the care plan discussed today. Patient's medical and personal goals were discussed today. Barriers to current goals:  None Follow up as discussed in 1 years for: cpe Call sooner if  needed.  Chief Complaint  Patient presents with  . Establish Care    SUBJECTIVE:  Lisa Herring is a 32 y.o. female that presents to clinic today regarding the following issues: np  Ms. Foulks is a new patient presenting to establish care. She will have a hep c screen. She continues regular eye/dental exams. I will obtain her most recent report for cervical cancer screening from her GYN specialist. She does not have any first degree relatives w/ breast or colon cancer; due for mammogram at 40 and colonoscopy at 45. Her weight is down 2 lbs since 04/2023; BMI is 30.4.  Wt Readings from Last 3 Encounters:  06/21/23 85.5 kg (188 lb 9.6 oz)  05/14/23 86.2 kg (190 lb)  03/27/23 86.2 kg (190 lb)   Her BP is controlled; managed w/ lifestyle alone.  BP Readings from Last 3 Encounters:  06/21/23 97/62  05/14/23 (!) 133/94  03/27/23 133/87   Her medical history is notable for an enlarged uterus associated w/ uterine fibroids. She had associated obstruction of her ureter leading to hydronephrosis. In early 05/2023, she had abdominal myomectomy surgery per GYN and ureteral stent (since removed) per urology. She is recovering well from these procedures. Chart review notes that dating back to 04/2023, she has had a moderate renal decline w/ GFR in the 40-50s prior to her surgery/procedures in early 05/2023. Her liver function is normal.  CHEM PROFILE  [facility]12/09/2022 Component 06/02/2023 06/01/2023 05/31/2023 05/19/2023                      Lipase -- -- -- 29         Sodium 137   --         Potassium 3.6  Chloride 106            CO2 27            Glucose, Bld             BUN 8 12 8  -- 18 12 --      Creatinine, Ser 1.03 High    1.4 High    0.97 -- 1.11 High    1.15 High    -- 1.43 High    1.53 High    -- 1.3 High     Calcium 8.4 Low      --      --   Total Protein -- -- 6.8 --  -- -- -- -- --   Albumin  -- -- 3.7 --  -- -- -- -- --   AST -- -- 34 --  -- -- -- -- --    ALT -- -- 35 --  -- -- -- -- --   Alkaline Phosphatase -- -- 55 --  -- -- -- -- --   Total Bilirubin -- -- 0.9 --  -- -- -- -- --   GFR, Estimated >60 51 Low     >60 -- >60 >60 -- 50 Low     46 Low        She has had mildly low magnesium (1.7) and mildly low calcium (8.4) as well. She had a post-operative anemia w/ hemoglobin 9.6:  Component 06/01/2023 05/19/2023              WBC 11.7 High    5.1      RBC 3.22 Low    4.72      Hemoglobin 9.6 Low    14.1      HCT 28.3 Low           HISTORY: I have reviewed the patients problem list, current medications, allergies, and social history and updated them as needed.  Past Medical History:  Diagnosis Date  . Bacterial vaginal infection 06/2018  . Bulky or enlarged uterus 05/15/2023  . Uterine fibroid    No family history on file.  Past Surgical History:  Procedure Laterality Date  . MYOMECTOMY ABDOMINAL APPROACH  05/2023  . URETERAL STENT PLACEMENT  05/2023    No current outpatient medications on file.   No current facility-administered medications for this visit.    Ms. Hendryx  reports that she has never smoked. She has never used smokeless tobacco.  ROS: The patient denies any fevers, chills, night sweats, headache, blurred vision, sore throat, coughing, shortness of breath, sputum production, chest pain, abdominal pain, nausea/vomiting/diarrhea, blood in the stool, dysuria, blood in urine, or leg swelling.   Review of Systems - All other systems reviewed are negative except as noted above.  OBJECTIVE:  Ms. Wussow  height is 1.676 m (5' 6) and weight is 85.5 kg (188 lb 9.6 oz). Her blood pressure is 97/62 and her pulse is 96. Her oxygen saturation is 98%.   PHYSICAL:  General Appearance: Black female w BMI 30.4. Alert and appears in no acute distress. Sitting comfortably in chair. Head: Normocephalic, without obvious abnormality, atraumatic.  Respiratory: Good air movement throughout. No accessory muscle use,  increased work of breathing or respiratory distress. Clear to auscultation in all lung fields. Normal effort of breathing, non-labored. No adventitious lung sounds including wheezes, rhochi or crackles. Cardiovascular: Regular rate and rhythm Gastrointestinal: No CVAT bilaterally. Musculoskeletal: No pain with palpation of the spine or paraspinal muscles.  Extremities: No peripheral  edema. 2+ radial pulse present.  Skin: Warm, dry. Psychiatric: Pleasant. Mood and affect normal. Not anxious or tearful. Neurological: Alert.  Rosalva Catchings, PA-C Internal Medicine at [facility] 06/21/2023 12:58 PM

## 2023-07-05 ENCOUNTER — Ambulatory Visit (HOSPITAL_BASED_OUTPATIENT_CLINIC_OR_DEPARTMENT_OTHER)
Admission: RE | Admit: 2023-07-05 | Discharge: 2023-07-05 | Disposition: A | Discharge status: Home / Self Care | Payer: 59 | Source: Ambulatory Visit | Attending: Urology | Admitting: Urology

## 2023-07-05 DIAGNOSIS — N1330 Unspecified hydronephrosis: Secondary | ICD-10-CM | POA: Insufficient documentation

## 2023-07-21 ENCOUNTER — Encounter: Payer: Self-pay | Admitting: Urology

## 2023-07-21 ENCOUNTER — Ambulatory Visit (INDEPENDENT_AMBULATORY_CARE_PROVIDER_SITE_OTHER): Payer: 59 | Admitting: Urology

## 2023-07-21 VITALS — BP 119/70 | HR 79

## 2023-07-21 DIAGNOSIS — N1330 Unspecified hydronephrosis: Secondary | ICD-10-CM

## 2023-07-21 NOTE — Progress Notes (Signed)
   Assessment: 1. Hydronephrosis, unspecified hydronephrosis type     Plan: Today I reviewed the renal ultrasound which shows normal-appearing kidneys bilaterally without evidence of hydro. Patient will follow-up as needed  Chief Complaint: Kidney FU  HPI: Lisa Herring is a 33 y.o. female who presents for continued evaluation of right hydronephrosis. See my note 05/16/2023 to time of initial consultation in the hospital.  Briefly the patient had large uterine fibroids with resulting right-sided hydro and flank pain.  Patient underwent placement of a right double-J stent on 05/16/2023. She underwent myomectomy on 05/31/23. Dj stent was removed 06/15/2023. Patient has done very well since her stent removal.  No current complaints. Patient had follow-up renal ultrasound performed on 07/05/2023 that was unremarkable showing normal-appearing kidneys without evidence of hydro bilaterally.  Portions of the above documentation were copied from a prior visit for review purposes only.  Allergies: No Known Allergies  PMH: Past Medical History:  Diagnosis Date   Uterine fibroid     PSH: Past Surgical History:  Procedure Laterality Date   CYSTOSCOPY W/ URETERAL STENT PLACEMENT Right 05/16/2023   Procedure: CYSTOSCOPY WITH RETROGRADE PYELOGRAM/URETERAL STENT PLACEMENT;  Surgeon: Joline Maxcy, MD;  Location: [facility] OR;  Service: Urology;  Laterality: Right;   MYOMECTOMY N/A 05/31/2023   Procedure: ABDOMINAL MYOMECTOMY;  Surgeon: Hoover Browns, MD;  Location: [facility] OR;  Service: Gynecology;  Laterality: N/A;   WISDOM TOOTH EXTRACTION      SH: Social History   Tobacco Use   Smoking status: Never   Smokeless tobacco: Never  Vaping Use   Vaping status: Never Used  Substance Use Topics   Alcohol use: Yes    Comment: socially   Drug use: No    ROS: Constitutional:  Negative for fever, chills, weight loss CV: Negative for chest pain, previous MI, hypertension Respiratory:  Negative for  shortness of breath, wheezing, sleep apnea, frequent cough GI:  Negative for nausea, vomiting, bloody stool, GERD  PE: Vitals:   07/21/23 1457  BP: 119/70  Pulse: 79    GENERAL APPEARANCE:  Well appearing, well developed, well nourished, NAD    Results: Ua pending

## 2023-07-26 LAB — URINALYSIS, ROUTINE W REFLEX MICROSCOPIC
Bilirubin, UA: NEGATIVE
Glucose, UA: NEGATIVE
Ketones, UA: NEGATIVE
Leukocytes,UA: NEGATIVE
Nitrite, UA: NEGATIVE
Protein,UA: NEGATIVE
RBC, UA: NEGATIVE
Specific Gravity, UA: 1.015 (ref 1.005–1.030)
Urobilinogen, Ur: 0.2 mg/dL (ref 0.2–1.0)
pH, UA: 6 (ref 5.0–7.5)

## 2024-02-12 NOTE — Progress Notes (Signed)
 Subjective: Patient ID:  Lisa Herring is a 33 y.o. female  Chief Complaint  Patient presents with  . Rash    Notice rash Thursday night on chest     The following information was reviewed by members of the visit team:  Tobacco  Allergies  Meds  Problems  Med Hx  Surg Hx  OB Status   Fam Hx    33 year old female presents for evaluation of symptoms ongoing 2 days.  She complains of a rash on the chest, this is not painful or itching unless she gets in the shower with warm water .  She denies any other areas of rash, also no new foods, medications, personal care products or detergents.  She states no others in the home have this rash.   She also denies fever, chills or other symptoms. She does not appear to be in distress during this exam.    Rash Pertinent negatives include no diarrhea, fever, shortness of breath or vomiting.     Review of Systems  Constitutional:  Negative for chills and fever.  Respiratory:  Negative for shortness of breath.   Cardiovascular:  Negative for chest pain.  Gastrointestinal:  Negative for abdominal pain, diarrhea, nausea and vomiting.  Genitourinary:  Negative for dysuria.  Skin:  Positive for rash.  Neurological:  Negative for dizziness and headaches.      Objective  Vitals:   02/12/24 1849  BP: 125/73  BP Location: Left arm  Patient Position: Sitting  Pulse: 73  Resp: 18  Temp: 97.2 F (36.2 C)  TempSrc: Tympanic  SpO2: 98%  Weight: 91.2 kg (201 lb)  Height: 1.676 m (5' 6)    Patient's last menstrual period was 01/28/2024.   Behavioral Health Screening  Patient Health Questionnaire-2 Score: 0 (02/12/2024  6:49 PM)      Patient's Depression screening is Negative   Depression Plan: Normal/Negative Screening  Physical Exam Vitals and nursing note reviewed.  Constitutional:      Appearance: Normal appearance.   Cardiovascular:     Rate and Rhythm: Normal rate and regular rhythm.     Heart sounds: Normal heart  sounds.  Pulmonary:     Effort: Pulmonary effort is normal.     Breath sounds: Normal breath sounds.   Skin:    General: Skin is warm and dry.     Capillary Refill: Capillary refill takes 2 to 3 seconds.     Findings: Rash present.         Comments: Upper chest, erythematous raised irregular bordered lesion, blanches with pressure, no pustules or open lesions   Neurological:     General: No focal deficit present.     Mental Status: She is alert and oriented to person, place, and time.   Psychiatric:        Mood and Affect: Mood normal.        Behavior: Behavior normal.      No results found for this or any previous visit (from the past 24 hours).   Radiologist interpretation:   No orders to display   Urgent care course: Discussed with patient this rash appears to be fungal rather than bacterial, trial of medications sent in for her, she has been advised to return for any new or worsening symptoms, she verbalizes agreement and understanding of the treatment plan.  Assessment/Plan   Barabara was seen today for rash.  Diagnoses and all orders for this visit:  Rash and nonspecific skin eruption -  nystatin (MYCOSTATIN) 100,000 unit/gram powder; Apply topically 2 (two) times a day. -     clotrimazole-betamethasone (LOTRISONE) 1-0.05 % cream; Apply 1 Application topically 2 (two) times a day for 10 days.    Patient has been instructed on RX/ OTC medications, dosages, side effects, and possible interactions as associated with each diagnosis in my impression and plan above.  2.   Patient education (verbal/handout) given on diagnosis, pathophysiology, treatment of diagnosis, side effects of medication use for treatment, restrictions while taking medication.  Supportive       Care measures as directed on AVS.  Red Flags associated with diagnosis/es were reviewed and patient instructed on action plan if red flags develop.  3.   Urgent Care Disposition:  Follow up with PCP        They have been instructed that if symptoms worse that should go to Urgent Care, go to the nearest ED, or activate EMS.  4.   Patient agreed with plan and voiced understanding.  NO barriers to adherence perceived by myself.  Electronically signed: Rosaline Jama Collet FNP  Dju 02/12/2024 7:10 PM

## 2024-03-25 NOTE — Progress Notes (Addendum)
 [address] - AMBULATORY [facility] [address] [place] [address]   Date of Service: 03/25/2024 Patient DOB: [birth year]    History of Present Illness   Patient ID: Lisa Herring is a 33 y.o. female. Patient presents to urgent care due to stools for 6 days.  Patient states that she was traveling locally for work when she started to experience some constipation and took MiraLAX  7-8 days ago with subsequent loose stools 6 days ago.  Started to experience more than 10 watery stools initially, but has decreased in frequency. Denies recent ABX use. States that she did take Pepto-Bismol which did not improve symptoms.  Overall feels well and is able to tolerate typical p.o. intake.  States she typically has a high-fiber and protein diet.  Denies fever, fatigue, dizziness, nausea, vomiting, abdominal pain, dark or bloody stools.   Active Ambulatory Problems    Diagnosis Date Noted  . Hydronephrosis 05/15/2023  . Intramural leiomyoma of uterus 05/31/2023  . History of obstruction of ureter 06/21/2023  . S/P myomectomy 06/21/2023  . Obesity (BMI 30-39.9) 06/21/2023  . Vitamin D deficiency 06/22/2023  . Hypomagnesemia 06/22/2023  . IDA (iron deficiency anemia) 06/22/2023  . Neutropenia 06/22/2023   Resolved Ambulatory Problems    Diagnosis Date Noted  . Bulky or enlarged uterus 05/15/2023   Past Medical History:  Diagnosis Date  . Bacterial vaginal infection 06/2018  . Uterine fibroid     BP 125/76 (BP Location: Left arm, Patient Position: Sitting)   Pulse 87   Ht 1.676 m (5' 6)   Wt 93.2 kg (205 lb 6.4 oz)   BMI 33.15 kg/m    Review of Systems   Review of Systems  Gastrointestinal:  Positive for diarrhea.     Physicial Exam   Physical Exam Vitals and nursing note reviewed.  Constitutional:      Appearance: Normal appearance. She is normal weight.  HENT:     Head: Normocephalic  and atraumatic.     Right Ear: External ear normal.     Left Ear: External ear normal.     Nose: Nose normal.     Mouth/Throat:     Mouth: Mucous membranes are moist.     Pharynx: Oropharynx is clear.  Eyes:     Extraocular Movements: Extraocular movements intact.     Conjunctiva/sclera: Conjunctivae normal.  Cardiovascular:     Rate and Rhythm: Normal rate and regular rhythm.     Pulses: Normal pulses.     Heart sounds: Normal heart sounds.  Pulmonary:     Effort: Pulmonary effort is normal.     Breath sounds: Normal breath sounds.  Abdominal:     General: Abdomen is flat. Bowel sounds are normal. There is no distension.     Palpations: Abdomen is soft. There is no mass.     Tenderness: There is no abdominal tenderness. There is no guarding or rebound.     Hernia: No hernia is present.  Musculoskeletal:        General: Normal range of motion.     Cervical back: Normal range of motion and neck supple.  Skin:    General: Skin is warm and dry.  Neurological:     General: No focal deficit present.     Mental Status: She is alert and oriented to person, place, and time.  Psychiatric:        Mood and Affect: Mood normal.  Diagnosis   There are no diagnoses linked to this encounter.   Medical Decision Making Lisa Herring is a 33 y.o. female who presents to urgent care today with complaints of  Chief Complaint  Patient presents with  . Diarrhea    Pt states that she has has some loose stools since Monday. Pt states that last Friday she took some miralax  for some constipation. Pt states that it has not slowed down since starting on Monday. Pt denies any other symptoms.    On exam, VS stable and patient is afebrile. Appears well-hydrated and capillary refill <2 seconds.  Ddx: gastroenteritis, IBS, UC, crohns, infectious  Patient presents to urgent care due to diarrhea.  Patient does not appear to be hypotensive, tachycardic, or exhibit any abnormal findings on PE at this  time.  Patient reports progressive improvement in symptoms she is able to tolerate regular p.o. intake.  Can try OTC antidiarrheals.  Red flag symptoms and return precautions provided.  Discharge Information  Symptomatic management discussed.  Patient was given verbal and written instructions on symptoms that necessitate return to the UC/ED, and instructed to f/u w/ UC or PCP if not improving in expected timeframe.   Patient/parent has been instructed on RX/OTC medications, dosages, side effects, and possible interactions as associated with each diagnosis in my impression and plan above.   Patient education (verbal/handout) given on diagnosis, pathophysiology, treatment of diagnosis, side effects of medication use for treatment, restrictions while taking medication, supportives measures such as staying hydrated.   Red Flags associated with diagnosis/es were reviewed and patient instructed on action plan if red flags develop.   They have been instructed that if symptoms worsen or red flags develop they should return to Urgent Care, go to the nearest ED, or activate EMS/911.     Patient and/or parent/guardian (if applicable) agreed with plan and voiced understanding.  No barriers to adherence perceived by myself.   Portions of this note may have been dictated using Dragon dictation software/hardware and may contain grammatical or spelling errors.    If a new prescription was given today, then I discussed potential side effects, drug interactions, instructions for taking the medication, and the consequences of not taking it.    F/u: Follow up closely with primary care provider (PCP) and other specialists for further care and routine care, but seek medical attention sooner if worsening/concerning signs or symptoms.  Home Care   Electronically signed by: Darryle Slater Fish, PA-C 03/25/2024 11:02 AM

## 2024-03-28 NOTE — Progress Notes (Signed)
 [address] - AMBULATORY [facility] [address] [place] [address]  Date of Service: 03/28/2024 Patient DOB: December 06, 1990    History of Present Illness   Patient ID: Lisa Herring is a 33 y.o. female. Patient comes in for Diarrhea (Pt reports 8 days of painless diarrhea. Denies abdominal pain, fever or chills. Reports being seen for same on Saturday and has taken 11/12 prescribed doses of imodium without relief. ) .  Lisa Herring is a pleasant 33 y.o. female complaining of continuation of diarrhea for the past 9 days which have been unchanged since onset. she has tried immodium 11 doses and pepto for symptom relief no fever or chills, gas sensation, no pain or bloating, no nausea or vomiting, no lightheadedness, dizziness or abdominal cramping. No bad food or water , no blood or mucous in the stool, is brownish yellow watery mainly will have semi soft stool and the remaining is watery.   03/25/2024 UC visit: Patient presents to urgent care due to stools for 6 days.  Patient states that she was traveling locally for work when she started to experience some constipation and took MiraLAX  7-8 days ago with subsequent loose stools 6 days ago.  Started to experience more than 10 watery stools initially, but has decreased in frequency. Denies recent ABX use. States that she did take Pepto-Bismol which did not improve symptoms.  Overall feels well and is able to tolerate typical p.o. intake.  States she typically has a high-fiber and protein diet.  Denies fever, fatigue, dizziness, nausea, vomiting, abdominal pain, dark or bloody stools.  Diarrhea  Pertinent negatives include no abdominal pain, chills, coughing, fever, headaches or vomiting.     Active Ambulatory Problems    Diagnosis Date Noted  . Hydronephrosis 05/15/2023  . Intramural leiomyoma of uterus 05/31/2023  . History of obstruction of ureter  06/21/2023  . S/P myomectomy 06/21/2023  . Obesity (BMI 30-39.9) 06/21/2023  . Vitamin D deficiency 06/22/2023  . Hypomagnesemia 06/22/2023  . IDA (iron deficiency anemia) 06/22/2023  . Neutropenia 06/22/2023   Resolved Ambulatory Problems    Diagnosis Date Noted  . Bulky or enlarged uterus 05/15/2023   Past Medical History:  Diagnosis Date  . Bacterial vaginal infection 06/2018  . Uterine fibroid     Review of Systems   Review of Systems  Constitutional:  Negative for appetite change, chills, fatigue and fever.  HENT:  Negative for congestion, rhinorrhea and sore throat.   Respiratory:  Negative for cough, shortness of breath and wheezing.   Gastrointestinal:  Positive for diarrhea. Negative for abdominal distention, abdominal pain, blood in stool, constipation, nausea and vomiting.  Genitourinary:  Negative for dysuria, frequency and urgency.  Musculoskeletal:  Negative for gait problem.  Neurological:  Negative for dizziness, light-headedness and headaches.  Hematological:  Does not bruise/bleed easily.     Immunization History  Administered Date(s) Administered  . DTP 05/16/1991, 07/10/1991, 09/14/1991, 10/17/1992  . Dtap, Unspecified 03/16/1995  . HPV, Quadrivalent 10/29/2005, 09/30/2006, 02/03/2007  . Hep B, Unspecified 08/01/1990, 04/19/1991, 10/17/1992  . Hepatitis A pediatric/adolescent (VAQTA PEDS) 1Y-18Y 03/16/2007, 09/13/2007  . HiB, Unspecified 05/16/1991, 07/16/1991, 09/14/1991, 07/01/1992  . Influenza, Injectable, Quadrivalent, Preservative Free 06/26/2020  . MMR 07/01/1992, 07/01/1994, 03/16/1995  . Meningococcal 4-valent IM (MENVEO) 12/03/2015  . Meningococcal MCV4P 03/16/2007  . Novel Influenza H1n1-09 05/15/2008  . OPV 05/16/1991, 07/10/1991, 09/14/1991  . Pfizer SARS-CoV-2 Primary Series 12+ yrs 09/01/2019, 09/29/2019  . Polio,  Unspecified 03/16/1995  . TD vaccine (ADULT)PF(TENIVAC) 7Y+ 07/01/2004  . TDAP VACCINE (BOOSTRIX,ADACEL) 7Y+ 12/03/2015   . Varicella SQ (VARIVAX) 1Y+ 03/28/2008    Physicial Exam   Vitals:   03/28/24 1735  BP: 127/77  BP Location: Left arm  Patient Position: Sitting  Pulse: 72  Resp: 16  Temp: 97.5 F (36.4 C)  TempSrc: Tympanic  SpO2: 99%  Weight: 90.7 kg (200 lb)  Height: 1.676 m (5' 6)   Birth weight not on file   Physical Exam Vitals and nursing note reviewed.  Constitutional:      General: She is not in acute distress.    Appearance: Normal appearance. She is obese. She is not ill-appearing or toxic-appearing.  HENT:     Head: Normocephalic.     Mouth/Throat:     Mouth: Mucous membranes are moist.     Pharynx: No posterior oropharyngeal erythema.  Eyes:     Conjunctiva/sclera: Conjunctivae normal.  Cardiovascular:     Rate and Rhythm: Normal rate and regular rhythm.     Heart sounds: Normal heart sounds.  Pulmonary:     Effort: Pulmonary effort is normal. No respiratory distress.     Breath sounds: Normal breath sounds. No stridor. No wheezing, rhonchi or rales.  Abdominal:     General: Abdomen is flat. Bowel sounds are normal. There is no distension.     Palpations: Abdomen is soft. There is no mass.     Tenderness: There is no abdominal tenderness. There is no guarding or rebound.  Skin:    General: Skin is warm and dry.     Capillary Refill: Capillary refill takes less than 2 seconds.  Neurological:     General: No focal deficit present.     Mental Status: She is alert.  Psychiatric:        Mood and Affect: Mood normal.       Diagnosis   Lisa Herring was seen today for diarrhea.  Diagnoses and all orders for this visit:  Functional diarrhea -     L. acidophilus/Bifid. animalis 20 billion cell cap; Take 1 capsule by mouth daily. -     Gastrointestinal Pathogen Profile, Stool, NAAT -     diphenoxylate-atropine (LOMOTIL) 2.5-0.025 mg per tablet; Take 1 tablet by mouth 4 (four) times a day as needed for diarrhea.     Medical Decision Making Lisa Herring is a  pleasant 33 y.o. female complaining of continuation of diarrhea for the past 9 days which have been unchanged since onset. she has tried immodium 11 doses and pepto for symptom relief no fever or chills, gas sensation, no pain or bloating, no nausea or vomiting, no lightheadedness, dizziness or abdominal cramping. No bad food or water , no blood or mucous in the stool, is brownish yellow watery mainly will have semi soft stool and the remaining is watery.   PE as above GIP is pending  Discussed with patient most likely related to imbalance in intestinal flora related to use of miralax  and dietary intake of over 200 grams of protein/high fiber diet. She has failed with immodium, pepto and yogurt. Discussed use of probiotic and lomotil, will send GIP to look for any infectious cause she is not having any issues with fever, VSS and no tachycardia No bloody or mucous like stool no abdominal pain to be concerned with UC, crohn's or IBS. Stable to d/c home    Urgent Care Disposition:  Home Care  Patient cleared for discharge. Discussed return precautions. Instructed on  supportive care measures - tylenol /motrin  for fever or discomfort, ensure adequate hydration, good hand hygiene to prevent spread of illness. Plan to follow up with PCP in 2-4 days if symptoms are not improving.   If showing URI symptoms, pt was examined in full PPE; mask, goggles, face shield, gown, and gloves.  Hands washed properly before and after encounter with patient.  Labs   No visits with results within 3 Day(s) from this visit.  Latest known visit with results is:  Lab on 06/21/2023  Component Date Value Ref Range Status  . Albumin , Urine 06/21/2023 8  Not Established mg/L Final  . Creatinine, Urine 06/21/2023 181  >=20 mg/dL Final  . Albumin /Creatinine Ratio, Urine 06/21/2023 4  0 - 30 mg/g creat Final   Normal: <30 mcgr albumin /gr Cr  Moderately increased: 30-299  Clinical albuminuria: >=300  . Sodium 06/21/2023 138  136  - 145 mmol/L Final  . Potassium 06/21/2023 4.2  3.5 - 5.1 mmol/L Final   NO VISIBLE HEMOLYSIS  . Chloride 06/21/2023 104  98 - 107 mmol/L Final  . CO2 06/21/2023 25  21 - 31 mmol/L Final  . Anion Gap 06/21/2023 9  6 - 14 mmol/L Final  . Glucose, Random 06/21/2023 85  70 - 99 mg/dL Final  . Blood Urea Nitrogen (BUN) 06/21/2023 12  7 - 25 mg/dL Final  . Creatinine 87/76/7975 0.84  0.60 - 1.20 mg/dL Final  . eGFR 87/76/7975 >90  >59 mL/min/1.44m2 Final   GFR estimated by CKD-EPI equations(NKF 2021).   Recommend confirmation of Cr-based eGFR by using Cys-based eGFR and other filtration markers (if applicable) in complex cases and clinical decision-making, as needed.  . Albumin  06/21/2023 4.3  3.5 - 5.7 g/dL Final  . Total Protein 06/21/2023 7.1  6.4 - 8.9 g/dL Final  . Bilirubin, Total 06/21/2023 0.3  0.3 - 1.0 mg/dL Final  . Alkaline Phosphatase (ALP) 06/21/2023 64  34 - 104 U/L Final  . Aspartate Aminotransferase (AST) 06/21/2023 15  13 - 39 U/L Final  . Alanine Aminotransferase (ALT) 06/21/2023 14  7 - 52 U/L Final  . Calcium 06/21/2023 9.4  8.6 - 10.3 mg/dL Final  . BUN/Creatinine Ratio 06/21/2023    Final   Creatinine is normal, ratio is not clinically indicated.   SABRA TSH 06/21/2023 0.686  0.450 - 5.330 uIU/mL Final  . Hemoglobin A1c 06/21/2023 5.3  <5.7 % Final   Normal A1c:             Less than 5.7%  Prediabetes range A1c:  5.7% to 6.4%  Diabetes range A1c:     Greater than 6.4%  . Estimated Average Glucose 06/21/2023 105  mg/dL Final   The ADA has supported the calculation of Average Glucose (eAG) based on HbA1c measurements. However, the eAG reflects the average glycemic level over 2-3 months and it would not necessarily match single glucose laboratory measurements, nor reflect changes in the daily glucose concentration.  . Vitamin D 25-Hydroxy 06/21/2023 20.9 (L)  30.0 - 100.0 ng/mL Final   Deficient: <20 ng/mL  Insufficient: 20-30 ng/mL  Sufficient: 30-100 ng/mL  Upper  Safety Limit/Toxicity: >100 ng/mL   . Magnesium 06/21/2023 1.7 (L)  1.9 - 2.7 mg/dL Final  . WBC 87/76/7975 3.60 (L)  4.40 - 11.00 10*3/uL Final  . RBC 06/21/2023 3.90 (L)  4.10 - 5.10 10*6/uL Final  . Hemoglobin 06/21/2023 11.4 (L)  12.3 - 15.3 g/dL Final  . Hematocrit 87/76/7975 34.6 (L)  35.9 - 44.6 % Final  .  Mean Corpuscular Volume (MCV) 06/21/2023 88.7  80.0 - 96.0 fL Final  . Mean Corpuscular Hemoglobin (MCH) 06/21/2023 29.2  27.5 - 33.2 pg Final  . Mean Corpuscular Hemoglobin Conc (* 06/21/2023 32.9 (L)  33.0 - 37.0 g/dL Final  . Red Cell Distribution Width (RDW) 06/21/2023 12.9  12.3 - 17.0 % Final  . Platelet Count (PLT) 06/21/2023 272  150 - 450 10*3/uL Final  . Mean Platelet Volume (MPV) 06/21/2023 9.8  6.8 - 10.2 fL Final  . Neutrophils % 06/21/2023 49  % Final  . Lymphocytes % 06/21/2023 41  % Final  . Monocytes % 06/21/2023 8  % Final  . Eosinophils % 06/21/2023 2  % Final  . Basophils % 06/21/2023 1  % Final  . nRBC % 06/21/2023 0  % Final  . Neutrophils Absolute 06/21/2023 1.80  1.80 - 7.80 10*3/uL Final  . Lymphocytes # 06/21/2023 1.50  1.00 - 4.80 10*3/uL Final  . Monocytes # 06/21/2023 0.30  0.00 - 0.80 10*3/uL Final  . Eosinophils # 06/21/2023 0.10  0.00 - 0.50 10*3/uL Final  . Basophils # 06/21/2023 0.00  0.00 - 0.20 10*3/uL Final  . nRBC Absolute 06/21/2023 0.00  <=0.00 10*3/uL Final  . Hepatitis C Virus Antibody 06/21/2023 Non-Reactive  Non-Reactive Final   The performance of the [employer] HCV assay has not been established with neonatal specimens.  . Vitamin B-12 06/21/2023 665  180 - 914 pg/mL Final   Normal Range: 180-914 pg/mL Indeterminate Range: 145-180 pg/mL  Deficient Range: <145 pg/mL   . Folate 06/21/2023 9.4  >5.8 ng/mL Final   >5.8 ng/mL: Normal  4.0-5.8 ng/mL: Indeterminate  <4.0 ng/mL: Deficient    . Iron 06/21/2023 34 (L)  50 - 212 ug/dL Final  . Transferrin 87/76/7975 307  203 - 362 mg/dL Final  . Ferritin 87/76/7975 13  11 - 307 ng/mL  Final  . Total Iron Binding Capacity (TIBC) 06/21/2023 439  290 - 518 ug/dL Final  . Transferrin Saturation 06/21/2023 8 (L)  15 - 45 % Final    Imaging   No results found for this or any previous visit (from the past 750 hours).  Discharge Information   Symptomatic management discussed.  Patient was given verbal and written instructions on symptoms that necessitate return to the UC/ED, and instructed to f/u w/ UC or PCP if not improving in expected timeframe.   Patient/parent has been instructed on RX/OTC medications, dosages, side effects, and possible interactions as associated with each diagnosis in my impression and plan above.   Patient education (verbal/handout) given on diagnosis, pathophysiology, treatment of diagnosis, side effects of medication use for treatment, restrictions while taking medication, supportives measures such as staying hydrated.   Red Flags associated with diagnosis/es were reviewed and patient instructed on action plan if red flags develop.   They have been instructed that if symptoms worsen or red flags develop they should return to Urgent Care, go to the nearest ED, or activate EMS/911.     Patient and/or parent/guardian (if applicable) agreed with plan and voiced understanding.  No barriers to adherence perceived by myself.  If a new prescription was given today, then I discussed potential side effects, drug interactions, instructions for taking the medication, and the consequences of not taking it.    F/u: Follow up closely with primary care provider (PCP) and other specialists for further care and routine care, but seek medical attention sooner if worsening/concerning signs or symptoms.     Electronically signed by:  Lisa LITTIE Lamprey, NP 03/28/2024 6:35 PM

## 2024-04-10 ENCOUNTER — Other Ambulatory Visit: Payer: Self-pay | Admitting: Obstetrics and Gynecology

## 2024-04-10 DIAGNOSIS — R102 Pelvic and perineal pain unspecified side: Secondary | ICD-10-CM

## 2024-04-12 ENCOUNTER — Inpatient Hospital Stay: Admission: RE | Admit: 2024-04-12 | Source: Ambulatory Visit

## 2024-04-14 ENCOUNTER — Ambulatory Visit
Admission: RE | Admit: 2024-04-14 | Discharge: 2024-04-14 | Disposition: A | Source: Ambulatory Visit | Attending: Obstetrics and Gynecology | Admitting: Obstetrics and Gynecology

## 2024-04-14 DIAGNOSIS — R102 Pelvic and perineal pain unspecified side: Secondary | ICD-10-CM
